# Patient Record
Sex: Male | Born: 1965 | Race: White | Hispanic: No | Marital: Married | Smoking: Never smoker
Health system: Southern US, Community
[De-identification: ages and names within clinical notes are randomized; demographics above are authoritative.]

## PROBLEM LIST (undated history)

## (undated) DIAGNOSIS — Z8719 Personal history of other diseases of the digestive system: Secondary | ICD-10-CM

## (undated) DIAGNOSIS — E785 Hyperlipidemia, unspecified: Secondary | ICD-10-CM

## (undated) DIAGNOSIS — E119 Type 2 diabetes mellitus without complications: Secondary | ICD-10-CM

## (undated) HISTORY — DX: Hyperlipidemia, unspecified: E78.5

## (undated) HISTORY — DX: Personal history of other diseases of the digestive system: Z87.19

## (undated) HISTORY — PX: APPENDECTOMY: SHX54

---

## 2004-10-08 ENCOUNTER — Other Ambulatory Visit: Payer: Self-pay

## 2004-10-08 ENCOUNTER — Emergency Department: Payer: Self-pay | Admitting: Emergency Medicine

## 2004-10-12 ENCOUNTER — Ambulatory Visit: Payer: Self-pay | Admitting: Emergency Medicine

## 2013-11-07 LAB — BASIC METABOLIC PANEL
Anion Gap: 6 — ABNORMAL LOW (ref 7–16)
BUN: 13 mg/dL (ref 7–18)
CHLORIDE: 99 mmol/L (ref 98–107)
CO2: 27 mmol/L (ref 21–32)
CREATININE: 0.98 mg/dL (ref 0.60–1.30)
Calcium, Total: 9.2 mg/dL (ref 8.5–10.1)
EGFR (African American): >60
EGFR (Non-African Amer.): >60
Glucose: 244 mg/dL — ABNORMAL HIGH (ref 65–99)
Osmolality: 273 (ref 275–301)
POTASSIUM: 3.9 mmol/L (ref 3.5–5.1)
Sodium: 132 mmol/L — ABNORMAL LOW (ref 136–145)

## 2013-11-07 LAB — CBC
HCT: 46.9 % (ref 40.0–52.0)
HGB: 15.8 g/dL (ref 13.0–18.0)
MCH: 30.2 pg (ref 26.0–34.0)
MCHC: 33.6 g/dL (ref 32.0–36.0)
MCV: 90 fL (ref 80–100)
PLATELETS: 363 10*3/uL (ref 150–440)
RBC: 5.23 10*6/uL (ref 4.40–5.90)
RDW: 12.4 % (ref 11.5–14.5)
WBC: 7.5 10*3/uL (ref 3.8–10.6)

## 2013-11-07 LAB — PROTIME-INR
INR: 1
PROTHROMBIN TIME: 12.6 s (ref 11.5–14.7)

## 2013-11-07 LAB — TROPONIN I: Troponin-I: <0.02 ng/mL

## 2013-11-07 LAB — PRO B NATRIURETIC PEPTIDE: B-Type Natriuretic Peptide: 8 pg/mL (ref 0–125)

## 2013-11-08 ENCOUNTER — Observation Stay: Payer: Self-pay | Admitting: Internal Medicine

## 2013-11-08 LAB — TROPONIN I: Troponin-I: <0.02 ng/mL

## 2013-11-08 LAB — LIPID PANEL
Cholesterol: 196 mg/dL (ref 0–200)
Cholesterol: 204 mg/dL — ABNORMAL HIGH (ref 0–200)
HDL Cholesterol: 36 mg/dL — ABNORMAL LOW (ref 40–60)
HDL: 36 mg/dL — AB (ref 40–60)
LDL CHOLESTEROL, CALC: 132 mg/dL — AB (ref 0–100)
Ldl Cholesterol, Calc: 127 mg/dL — ABNORMAL HIGH (ref 0–100)
TRIGLYCERIDES: 164 mg/dL (ref 0–200)
Triglycerides: 182 mg/dL (ref 0–200)
VLDL Cholesterol, Calc: 33 mg/dL (ref 5–40)
VLDL Cholesterol, Calc: 36 mg/dL (ref 5–40)

## 2013-11-08 LAB — CK-MB
CK-MB: 0.7 ng/mL (ref 0.5–3.6)
CK-MB: 0.8 ng/mL (ref 0.5–3.6)

## 2014-07-23 DIAGNOSIS — E119 Type 2 diabetes mellitus without complications: Secondary | ICD-10-CM | POA: Insufficient documentation

## 2014-07-23 DIAGNOSIS — E785 Hyperlipidemia, unspecified: Secondary | ICD-10-CM | POA: Insufficient documentation

## 2014-11-30 NOTE — Discharge Summary (Signed)
PATIENT NAME:  Luke Russo, Luke Russo MR#:  [id number] DATE OF BIRTH:  11-Sep-1965  DATE OF ADMISSION:  11/08/2013 DATE OF DISCHARGE:  11/08/2013  ADMITTING DIAGNOSIS: Chest pain.   DISCHARGE DIAGNOSES: 1.  Chest pain, felt to be noncardiac in origin, possible musculoskeletal, possible gastrointestinal-related.  2.  Diabetes.  3.  Hyperlipidemia.  4.  Gastroesophageal reflux disease.  5.  Alcohol use.   PERTINENT LABS AND EVALUATIONS: Admitting cholesterol 196, triglycerides 164, HDL 36, LDL 127. INR 1. BNP 8. Glucose 244, BUN 13, creatinine 0.98, sodium 132, potassium 3.9, chloride 99, CO2 27, calcium 9.2. WBC 7.5, hemoglobin 15.8, platelet count 366,000. Troponin less than 0.02.   PA and lateral chest x-ray shows no acute cardiopulmonary processes.   Fasting lipid panel: Total cholesterol 204, triglycerides 182, HDL 36, LDL 132.  Nuclear myocardial perfusion scan showed no wall motion abnormality, no perfusion defect, EF 73%.   HOSPITAL COURSE: Please refer to H and P done by the admitting physician. The patient is a 49 year old white male with history of diabetes and hypertension who presented with left-sided chest pain ongoing for the past 5 days. His pain was unusual in that it was worse with lying down and certain movements. It did not get worse with activity. Due to these symptoms, he was placed under observation and serial cardiac enzymes were done, which were negative. He underwent a stress MIBI which was negative for any ischemia or infarct. At this time, he is not having any chest pains and is stable for discharge. He will follow-up with cardiology. If he continues to have these symptoms, he will likely need a cardiac cath.   DISCHARGE MEDICATIONS: Gemfibrozil 600 one tab p.o. b.i.d., Flonase 1 spray daily, omeprazole 20 daily, lovastatin 40 daily, glipizide 10 mg 1 tab p.o. b.i.d., metformin 500 one tab p.o. t.i.d., Tradjenta 5 mg daily, aspirin 81 one tab p.o. daily.   DISCHARGE DIET:  Low-sodium, low-fat, low-cholesterol, carbohydrate-controlled.  DISCHARGE ACTIVITY: As tolerated.   DISCHARGE FOLLOWUP: Follow up with Dr. Humphrey Rolls next week. Follow with primary MD in 1 to 2 weeks.   TIME SPENT ON DISCHARGE: 32 minutes.  ____________________________ Lafonda Mosses Posey Pronto, MD shp:sb D: 11/09/2013 08:53:06 ET T: 11/09/2013 09:24:14 ET JOB#: [id number]  cc: Kavaughn Faucett H. Posey Pronto, MD, <Dictator> Alric Seton MD ELECTRONICALLY SIGNED 11/16/2013 8:49

## 2014-11-30 NOTE — Consult Note (Signed)
PATIENT NAME:  Luke Russo, Luke Russo MR#:  [id number] DATE OF BIRTH:  Nov 23, 1965  DATE OF CONSULTATION:  11/08/2013  CONSULTING PHYSICIAN: Dionisio David, M.D.   INDICATION FOR CONSULTATION: Chest pain.   HISTORY OF PRESENT ILLNESS: This is a 49 year old white male with a past medical history of diabetes. No history of hypertension, hyperlipidemia, who presented to the Emergency Room with chest pain. The chest pain was described as dull to pressure-type associated with shortness of breath. He denies any chest pain during stress test or after the stress test.   PAST MEDICAL HISTORY: As mentioned, history of diabetes.   SOCIAL HISTORY: He denies smoking, but drinks four glasses of bourbon every day after work.   FAMILY HISTORY: Positive for coronary artery disease.   PHYSICAL EXAMINATION: GENERAL: He was alert, oriented x 3, in no acute distress.  VITALS: Stable.  NECK: No JVD.  LUNGS: Clear.  HEART: Regular rate and rhythm. Normal S1, S2. No audible murmur.  ABDOMEN: Soft, nontender, positive bowel sounds.  EXTREMITIES: No pedal edema.  NEUROLOGIC: The patient appears to be intact.   EKG showed normal sinus rhythm, left ventricular hypertrophy, no acute changes. Cardiac enzymes were negative. Nuclear stress test was done which showed no perfusion defect, ejection fraction 73%. There was mild distal septal wall reversible defect, but it appears to be artifact. Advise; however, follow-up in the office at 11:00 on Tuesday next week.   Thank you very much for the referral.   ____________________________ Dionisio David, MD sak:sg D: 11/08/2013 14:09:21 ET T: 11/08/2013 14:28:06 ET JOB#: [id number]  cc: Dionisio David, MD, <Dictator> Dionisio David MD ELECTRONICALLY SIGNED 12/14/2013 11:02

## 2014-11-30 NOTE — H&P (Signed)
PATIENT NAME:  ESPIRIDION, Luke Russo MR#:  [id number] DATE OF BIRTH:  04-08-1966  DATE OF ADMISSION:  11/07/2013  PRIMARY CARE PHYSICIAN:  Dr. Juluis Pitch.   REFERRING PHYSICIAN:  Dr. Robet Leu.   CHIEF COMPLAINT:  Chest pain.   HISTORY OF PRESENT ILLNESS:  [name] is a 49 year old male with history of diabetes mellitus, on oral medication who comes to the Emergency Department with complaints of chest pain for the last five days.  The pain is on the left lateral aspect of the chest on and off.  Could not tell exacerbating and relieving factors.  Usually it is relieved by itself.  Does not get worse with taking a deep breath, moving.  The patient walks around at work most of the day without having any chest pain, palpitations.  Work-up in the Emergency Department with EKG and cardiac enzymes were unremarkable.  Concerning about the diabetes mellitus, the decision is made to admit the patient under observation.  Today, the patient started to experience severe pain on the left side of the chest radiating to both arms, especially left hand, felt cool to touch.  The patient currently denies having any symptoms.  PAST MEDICAL HISTORY: 1.  Diabetes mellitus.  2.  Hyperlipidemia.   PAST SURGICAL HISTORY:  None.   ALLERGIES:  No known drug allergies.   HOME MEDICATIONS:  1.  Tradjenta 5 mg once a day. 2.  Omeprazole 20 mg once a day.  3.  Metformin 500 mg 3 times a day.  4.  Lovastatin 40 mg once a day.  5.  Glipizide 10 mg 2 times a day.  6.  Gemfibrozil 600 mg 2 times a day.  7.  Flonase once a day.   SOCIAL HISTORY:  No history of smoking, drinking alcohol or using illicit drugs.  Married, lives with his wife.  Works as a Librarian, academic.   FAMILY HISTORY:  Mother died of heart problems.  Father had coronary artery bypass grafting.   REVIEW OF SYSTEMS:  CONSTITUTIONAL:  Denies any generalized weakness.  EYES:  No change in vision.  EARS, NOSE, THROAT:  No change in hearing.  RESPIRATORY:   No cough, shortness of breath.  CARDIOVASCULAR:  Has chest pain.  GASTROINTESTINAL:  No nausea, vomiting, abdominal pain.  GENITOURINARY:  No dysuria or hematuria.  ENDOCRINE:  No polyuria or polydipsia.  Has a diagnosis of diabetes mellitus.  HEMATOLOGY:  No easy bruising or bleeding. SKIN:  No rashes or lesions.  MUSCULOSKELETAL:  No joint pains and aches.  NEUROLOGIC:  No weakness or numbness in any part of the body.     PHYSICAL EXAMINATION: GENERAL:  This is a well-built, well-nourished, age-appropriate male lying down in the bed not in distress.  VITAL SIGNS:  Temperature 98.3, pulse 96, blood pressure 153/84, respiratory rate of 16, oxygen saturation is 98% on room air.  HEENT:  Head normocephalic, atraumatic.  Eyes, no scleral icterus.  Conjunctivae normal.  Pupils equal and react to light.  Extraocular movements are intact.  Mucous membranes moist.  No pharyngeal erythema.  NECK:  Supple.  No lymphadenopathy.  No JVD.  No carotid bruit.  CHEST:  Has no focal tenderness.  LUNGS:  Bilateral clear to auscultation.  HEART:  S1, S2 regular.  No murmurs are heard.  ABDOMEN:  Bowel sounds plus.  Soft, nontender, nondistended.  No hepatosplenomegaly.  EXTREMITIES:  No pedal edema.  Pulses 2+. NEUROLOGIC:  The patient is alert, oriented to place, person and time.  Cranial nerves  II through XII intact.  Motor 5 by 5 in upper and lower extremities.  SKIN:  No rash or lesions.  MUSCULOSKELETAL:  Good range of motion in all the extremities.   LABORATORY DATA:  Chest x-ray, PA and lateral:  No acute cardiopulmonary disease.   Coag profile is well within normal limits.  BNP is 8.  CBC, CMP are completely within normal limits.  Troponin less than 0.02.   EKG, 12-lead:  Normal sinus rhythm with no ST-T wave abnormalities.   ASSESSMENT AND PLAN:  [name] is a 49 year old male who comes to the Emergency Department with chest pain for five days duration.  1.  Chest pain.  This seems to be  more atypical pain, however considering the patient's history of diabetes mellitus and hyperlipidemia, admit the patient to a monitored bed, cycle cardiac enzymes x 3.  If negative, the patient can undergo exercise stress test in the morning.  If it is negative, the patient could be discharged home.  Keep the patient on aspirin and obtain lipid profile.  2.  Diabetes mellitus.  We will hold metformin for now.  Continue the glipizide and Tradjenta.  3.  Hyperlipidemia.  Continue with lovastatin.  4.  Keep the patient on deep vein thrombosis prophylaxis with Lovenox.   TIME SPENT:  45 minutes.     ____________________________ Monica Becton, MD pv:ea D: 11/07/2013 23:54:40 ET T: 11/08/2013 00:34:54 ET JOB#: [id number]  cc: Monica Becton, MD, <Dictator> Youlanda Roys. Lovie Macadamia, MD Monica Becton MD ELECTRONICALLY SIGNED 11/22/2013 0:24

## 2015-07-11 DIAGNOSIS — Z8719 Personal history of other diseases of the digestive system: Secondary | ICD-10-CM | POA: Insufficient documentation

## 2017-06-29 ENCOUNTER — Other Ambulatory Visit: Payer: Self-pay | Admitting: Family Medicine

## 2017-06-29 DIAGNOSIS — R1031 Right lower quadrant pain: Secondary | ICD-10-CM

## 2017-07-06 ENCOUNTER — Ambulatory Visit
Admission: RE | Admit: 2017-07-06 | Discharge: 2017-07-06 | Disposition: A | Discharge status: Home / Self Care | Payer: BLUE CROSS/BLUE SHIELD | Source: Ambulatory Visit | Attending: Family Medicine | Admitting: Family Medicine

## 2017-07-06 DIAGNOSIS — R1031 Right lower quadrant pain: Secondary | ICD-10-CM | POA: Diagnosis not present

## 2017-07-06 HISTORY — DX: Type 2 diabetes mellitus without complications: E11.9

## 2017-07-06 MED ORDER — IOPAMIDOL (ISOVUE-300) INJECTION 61%
100.0000 mL | Freq: Once | INTRAVENOUS | Status: AC | PRN
  Administered 2017-07-06: 100 mL via INTRAVENOUS

## 2017-07-08 ENCOUNTER — Ambulatory Visit
Admission: RE | Admit: 2017-07-08 | Discharge: 2017-07-08 | Disposition: A | Discharge status: Home / Self Care | Payer: BLUE CROSS/BLUE SHIELD | Source: Ambulatory Visit | Attending: Chiropractor | Admitting: Chiropractor

## 2017-07-08 ENCOUNTER — Other Ambulatory Visit: Payer: Self-pay | Admitting: Chiropractor

## 2017-07-08 DIAGNOSIS — R109 Unspecified abdominal pain: Secondary | ICD-10-CM | POA: Insufficient documentation

## 2017-07-08 DIAGNOSIS — R0781 Pleurodynia: Secondary | ICD-10-CM | POA: Insufficient documentation

## 2017-07-12 ENCOUNTER — Other Ambulatory Visit: Payer: Self-pay | Admitting: Internal Medicine

## 2017-07-12 DIAGNOSIS — R109 Unspecified abdominal pain: Secondary | ICD-10-CM

## 2017-07-18 ENCOUNTER — Ambulatory Visit: Admission: RE | Admit: 2017-07-18 | Payer: BLUE CROSS/BLUE SHIELD | Source: Ambulatory Visit

## 2017-07-18 ENCOUNTER — Ambulatory Visit
Admission: RE | Admit: 2017-07-18 | Discharge: 2017-07-18 | Disposition: A | Discharge status: Home / Self Care | Payer: BLUE CROSS/BLUE SHIELD | Source: Ambulatory Visit | Attending: Internal Medicine | Admitting: Internal Medicine

## 2017-07-18 DIAGNOSIS — R109 Unspecified abdominal pain: Secondary | ICD-10-CM | POA: Insufficient documentation

## 2017-07-21 ENCOUNTER — Inpatient Hospital Stay: Payer: BLUE CROSS/BLUE SHIELD

## 2017-07-21 ENCOUNTER — Other Ambulatory Visit: Payer: Self-pay

## 2017-07-21 ENCOUNTER — Encounter: Payer: Self-pay | Admitting: Oncology

## 2017-07-21 ENCOUNTER — Inpatient Hospital Stay: Payer: BLUE CROSS/BLUE SHIELD | Attending: Oncology | Admitting: Oncology

## 2017-07-21 VITALS — BP 139/82 | HR 99 | Temp 99.0°F | Wt 140.8 lb

## 2017-07-21 DIAGNOSIS — R1011 Right upper quadrant pain: Secondary | ICD-10-CM | POA: Insufficient documentation

## 2017-07-21 DIAGNOSIS — Z79899 Other long term (current) drug therapy: Secondary | ICD-10-CM | POA: Insufficient documentation

## 2017-07-21 DIAGNOSIS — Z7984 Long term (current) use of oral hypoglycemic drugs: Secondary | ICD-10-CM | POA: Insufficient documentation

## 2017-07-21 DIAGNOSIS — D75839 Thrombocytosis, unspecified: Secondary | ICD-10-CM

## 2017-07-21 DIAGNOSIS — D473 Essential (hemorrhagic) thrombocythemia: Secondary | ICD-10-CM | POA: Diagnosis not present

## 2017-07-21 DIAGNOSIS — E785 Hyperlipidemia, unspecified: Secondary | ICD-10-CM | POA: Insufficient documentation

## 2017-07-21 DIAGNOSIS — E119 Type 2 diabetes mellitus without complications: Secondary | ICD-10-CM | POA: Insufficient documentation

## 2017-07-21 DIAGNOSIS — K449 Diaphragmatic hernia without obstruction or gangrene: Secondary | ICD-10-CM | POA: Insufficient documentation

## 2017-07-21 DIAGNOSIS — R109 Unspecified abdominal pain: Secondary | ICD-10-CM | POA: Diagnosis not present

## 2017-07-21 LAB — COMPREHENSIVE METABOLIC PANEL
ALK PHOS: 78 U/L (ref 38–126)
ALT: 16 U/L — ABNORMAL LOW (ref 17–63)
ANION GAP: 9 (ref 5–15)
AST: 19 U/L (ref 15–41)
Albumin: 4.6 g/dL (ref 3.5–5.0)
BILIRUBIN TOTAL: 0.5 mg/dL (ref 0.3–1.2)
BUN: 21 mg/dL — ABNORMAL HIGH (ref 6–20)
CALCIUM: 9.5 mg/dL (ref 8.9–10.3)
CO2: 26 mmol/L (ref 22–32)
Chloride: 100 mmol/L — ABNORMAL LOW (ref 101–111)
Creatinine, Ser: 0.89 mg/dL (ref 0.61–1.24)
GFR calc non Af Amer: >60 mL/min (ref >60)
Glucose, Bld: 346 mg/dL — ABNORMAL HIGH (ref 65–99)
Potassium: 4.5 mmol/L (ref 3.5–5.1)
SODIUM: 135 mmol/L (ref 135–145)
TOTAL PROTEIN: 8.1 g/dL (ref 6.5–8.1)

## 2017-07-21 LAB — CBC WITH DIFFERENTIAL/PLATELET
Basophils Absolute: 0.1 10*3/uL (ref 0–0.1)
Basophils Relative: 1 %
EOS ABS: 0.1 10*3/uL (ref 0–0.7)
Eosinophils Relative: 2 %
HCT: 43.4 % (ref 40.0–52.0)
HEMOGLOBIN: 14.9 g/dL (ref 13.0–18.0)
Lymphocytes Relative: 26 %
Lymphs Abs: 1.5 10*3/uL (ref 1.0–3.6)
MCH: 30.2 pg (ref 26.0–34.0)
MCHC: 34.4 g/dL (ref 32.0–36.0)
MCV: 87.6 fL (ref 80.0–100.0)
MONOS PCT: 10 %
Monocytes Absolute: 0.6 10*3/uL (ref 0.2–1.0)
NEUTROS ABS: 3.5 10*3/uL (ref 1.4–6.5)
NEUTROS PCT: 61 %
Platelets: 494 10*3/uL — ABNORMAL HIGH (ref 150–440)
RBC: 4.95 MIL/uL (ref 4.40–5.90)
RDW: 12.1 % (ref 11.5–14.5)
WBC: 5.7 10*3/uL (ref 3.8–10.6)

## 2017-07-21 LAB — IRON AND TIBC
IRON: 91 ug/dL (ref 45–182)
Saturation Ratios: 20 % (ref 17.9–39.5)
TIBC: 448 ug/dL (ref 250–450)
UIBC: 357 ug/dL

## 2017-07-21 LAB — LACTATE DEHYDROGENASE: LDH: 104 U/L (ref 98–192)

## 2017-07-21 LAB — TECHNOLOGIST SMEAR REVIEW

## 2017-07-21 LAB — FERRITIN: Ferritin: 202 ng/mL (ref 24–336)

## 2017-07-21 NOTE — Progress Notes (Signed)
Patient here today as a new patient  

## 2017-07-21 NOTE — Progress Notes (Signed)
Hematology/Oncology Consult note [facility] [phone] [phone]   Patient Care Team: Juluis Pitch, MD as PCP - General (Family Medicine)  REFERRING PROVIDER: Juluis Pitch, MD CHIEF COMPLAINTS/PURPOSE OF CONSULTATION:  Evaluation of Thrombocytosis and abdominal pain.   HISTORY OF PRESENTING ILLNESS:  Luke Russo is a  51 y.o.  male with PMH listed below who was referred to me for evaluation of thrombocytosis. He had lab work done recently which showed platelet level of 567. Patient also reports that he has had constant right upper quadrant pain, sharp. He was seen by DrToledo and has had a negative work up including US kidney, CT abdomen pelvis.  Repors appetite is not good and has weight loss recently. feeling tired. He has poorly uncontrolled DM and has been recently started on multiple DM medicaiton.      Review of Systems  Constitutional: Negative for fever.  HENT: Negative for hearing loss.   Eyes: Negative for blurred vision.  Respiratory: Negative for cough.   Cardiovascular: Negative for chest pain.  Gastrointestinal: Positive for abdominal pain.  Genitourinary: Negative for dysuria.  Musculoskeletal: Negative for myalgias.  Skin: Negative for rash.  Neurological: Negative for dizziness.  Endo/Heme/Allergies: Does not bruise/bleed easily.  Psychiatric/Behavioral: Negative for depression.    MEDICAL HISTORY:  Past Medical History:  Diagnosis Date  . Diabetes mellitus without complication ([place])     SURGICAL HISTORY: Past Surgical History:  Procedure Laterality Date  . APPENDECTOMY      SOCIAL HISTORY: Social History   Socioeconomic History  . Marital status: Married    Spouse name: Not on file  . Number of children: Not on file  . Years of education: Not on file  . Highest education level: Not on file  Social Needs  . Financial resource strain: Not on file  . Food insecurity - worry: Not on file   . Food insecurity - inability: Not on file  . Transportation needs - medical: Not on file  . Transportation needs - non-medical: Not on file  Occupational History  . Not on file  Tobacco Use  . Smoking status: Not on file  Substance and Sexual Activity  . Alcohol use: Not on file  . Drug use: Not on file  . Sexual activity: Not on file  Other Topics Concern  . Not on file  Social History Narrative  . Not on file    FAMILY HISTORY: Family History  Problem Relation Age of Onset  . Diabetes Mother   . Breast cancer Mother   . Thyroid disease Mother   . Heart disease Mother   . Heart disease Father   . Hypertension Father   . Lupus Sister   . Diabetes Maternal Grandmother   . Heart disease Maternal Grandmother   . Diabetes Maternal Grandfather   . Heart disease Maternal Grandfather     ALLERGIES:  has No Known Allergies.  MEDICATIONS:  Current Outpatient Medications  Medication Sig Dispense Refill  . ALPRAZolam (XANAX) 0.5 MG tablet Take 0.5 mg by mouth as needed.  0  . FARXIGA 10 MG TABS tablet Take 10 mg by mouth daily.  1  . fluticasone (FLONASE) 50 MCG/ACT nasal spray instill 2 sprays into each nostril once daily    . gemfibrozil (LOPID) 600 MG tablet Take 600 mg by mouth 2 (two) times daily.  0  . glipiZIDE (GLUCOTROL) 10 MG tablet Take 10 mg by mouth 2 (two) times daily.  0  . lovastatin (MEVACOR) 40  MG tablet Take 40 mg by mouth daily.  0  . meloxicam (MOBIC) 15 MG tablet Take 15 mg by mouth daily.  0  . metFORMIN (GLUCOPHAGE-XR) 750 MG 24 hr tablet Take 750 mg by mouth 2 (two) times daily.  0  . omeprazole (PRILOSEC) 20 MG capsule Take 20 mg by mouth 2 (two) times daily.  0  . pioglitazone (ACTOS) 30 MG tablet Take 30 mg by mouth daily.    . traMADol (ULTRAM) 50 MG tablet Take 50 mg by mouth every 6 (six) hours as needed.  0   No current facility-administered medications for this visit.      PHYSICAL EXAMINATION: ECOG PERFORMANCE STATUS: 1 - Symptomatic  but completely ambulatory Vitals:   07/21/17 1120 07/21/17 1133  BP: 139/82 139/82  Pulse: 99 99  Temp: 99 F (37.2 C)    Filed Weights   07/21/17 1120 07/21/17 1133  Weight: 140 lb 12.2 oz (63.9 kg) 140 lb 12.2 oz (63.9 kg)    Physical Exam  Constitutional: He is oriented to person, place, and time and well-developed, well-nourished, and in no distress. No distress.  HENT:  Head: Normocephalic and atraumatic.  Mouth/Throat: No oropharyngeal exudate.  Eyes: Conjunctivae and EOM are normal. Pupils are equal, round, and reactive to light. Scleral icterus is present.  Neck: Normal range of motion. Neck supple.  Cardiovascular: Normal rate and regular rhythm.  Pulmonary/Chest: Effort normal and breath sounds normal.  Abdominal: Soft. Bowel sounds are normal. He exhibits no distension.  (+) hernia  Musculoskeletal: Normal range of motion. He exhibits no edema.  Lymphadenopathy:    He has no cervical adenopathy.  Neurological: He is alert and oriented to person, place, and time. Gait normal.  Skin: Skin is warm and dry.  Psychiatric: Affect normal.     LABORATORY DATA:  I have reviewed the data as listed Lab Results  Component Value Date   WBC 7.5 11/07/2013   HGB 15.8 11/07/2013   HCT 46.9 11/07/2013   MCV 90 11/07/2013   PLT 363 11/07/2013   Recent Labs    07/21/17 1159  NA 135  K 4.5  CL 100*  CO2 26  GLUCOSE 346*  BUN 21*  CREATININE 0.89  CALCIUM 9.5  GFRNONAA >60  GFRAA >60  PROT 8.1  ALBUMIN 4.6  AST 19  ALT 16*  ALKPHOS 78  BILITOT 0.5       ASSESSMENT & PLAN:  1. Thrombocytosis ([place])   2. Right-sided abdominal pain of unknown cause    I discussed with patient that the differential diagnosis of the thrombosis is broad, including benign etiology such as reactive to surgery, trauma, infection, nutrition deficiency, etc, as well as malignant etiology including underlying bone marrow disorders.   For the work up of patient's thrombocytosis, I  recommend checking CBC;CMP, LDH, iron panel, if persistent thrombocytosis, will send JAK 2 mutation,  BCR ABL; MPL; CALR mutation.   # etiology of abdominal pain is unknown. Was seen by surgery for possible inguinal hernia Dr. Tamala Julian did not feel that was likely contributing to his symptoms.  Also discussed possible bone marrow biopsy if above workup is inconclusive; however I would prefer not to do a bone marrow unless absolutely needed. All questions were answered. The patient knows to call the clinic with any problems questions or concerns.  Return of visit: 1 week. Thank you for this kind referral and the opportunity to participate in the care of this patient. A copy of today's note  is routed to referring provider    Earlie Server, MD, PhD Hematology Oncology [facility] at [facility] Pager- [phone] 07/21/2017

## 2017-07-22 ENCOUNTER — Encounter: Payer: Self-pay | Admitting: Oncology

## 2017-07-28 ENCOUNTER — Encounter: Payer: Self-pay | Admitting: Oncology

## 2017-07-28 ENCOUNTER — Other Ambulatory Visit: Payer: Self-pay

## 2017-07-28 ENCOUNTER — Inpatient Hospital Stay: Payer: BLUE CROSS/BLUE SHIELD

## 2017-07-28 ENCOUNTER — Inpatient Hospital Stay (HOSPITAL_BASED_OUTPATIENT_CLINIC_OR_DEPARTMENT_OTHER): Payer: BLUE CROSS/BLUE SHIELD | Admitting: Oncology

## 2017-07-28 VITALS — BP 131/79 | HR 90 | Temp 96.9°F | Wt 142.4 lb

## 2017-07-28 DIAGNOSIS — R1011 Right upper quadrant pain: Secondary | ICD-10-CM | POA: Diagnosis not present

## 2017-07-28 DIAGNOSIS — D75839 Thrombocytosis, unspecified: Secondary | ICD-10-CM

## 2017-07-28 DIAGNOSIS — D473 Essential (hemorrhagic) thrombocythemia: Secondary | ICD-10-CM

## 2017-07-28 DIAGNOSIS — R109 Unspecified abdominal pain: Secondary | ICD-10-CM

## 2017-07-28 LAB — CBC WITH DIFFERENTIAL/PLATELET
Basophils Absolute: 0.1 10*3/uL (ref 0–0.1)
Basophils Relative: 1 %
Eosinophils Absolute: 0.1 10*3/uL (ref 0–0.7)
Eosinophils Relative: 3 %
HEMATOCRIT: 42.6 % (ref 40.0–52.0)
HEMOGLOBIN: 14.7 g/dL (ref 13.0–18.0)
LYMPHS ABS: 1.9 10*3/uL (ref 1.0–3.6)
LYMPHS PCT: 34 %
MCH: 30.3 pg (ref 26.0–34.0)
MCHC: 34.4 g/dL (ref 32.0–36.0)
MCV: 88.1 fL (ref 80.0–100.0)
MONO ABS: 0.5 10*3/uL (ref 0.2–1.0)
MONOS PCT: 9 %
NEUTROS ABS: 3 10*3/uL (ref 1.4–6.5)
Neutrophils Relative %: 53 %
Platelets: 462 10*3/uL — ABNORMAL HIGH (ref 150–440)
RBC: 4.84 MIL/uL (ref 4.40–5.90)
RDW: 12.1 % (ref 11.5–14.5)
WBC: 5.6 10*3/uL (ref 3.8–10.6)

## 2017-07-28 NOTE — Progress Notes (Signed)
Hematology/Oncology Consult note [facility] Telephone:([phone] [phone]   Patient Care Team: Juluis Pitch, MD as PCP - General (Family Medicine)  REFERRING PROVIDER: Juluis Pitch, MD CHIEF COMPLAINTS/PURPOSE OF CONSULTATION:  Evaluation of Thrombocytosis and abdominal pain.   HISTORY OF PRESENTING ILLNESS:  Luke Russo is a  51 y.o.  male with PMH listed below who was referred to me for evaluation of thrombocytosis. He had lab work done recently which showed platelet level of 567. Patient also reports that he has had constant right upper quadrant pain, sharp. He was seen by DrToledo and has had a negative work up including US kidney, CT abdomen pelvis.  Repors appetite is not good and has weight loss recently. feeling tired. He has poorly uncontrolled DM and has been recently started on multiple DM medicaiton.   INTERVAL HISTORY Luke Russo is a 51 y.o. male who has above history reviewed by me today presents for follow up visit for management of abnormal labs. He continues to have right flank pain, which he reports starts at this back and wrap around to the front. Also reports skin sensitivity of the right flank when his cloth rubs the skin.   Review of Systems  Constitutional: Negative for fever.  HENT: Negative for hearing loss.   Eyes: Negative for blurred vision.  Respiratory: Negative for cough.   Cardiovascular: Negative for chest pain.  Gastrointestinal: Negative for vomiting.       Right flank pain  Genitourinary: Negative for dysuria.  Musculoskeletal: Negative for myalgias.  Skin: Negative for rash.  Neurological: Negative for dizziness.  Endo/Heme/Allergies: Does not bruise/bleed easily.  Psychiatric/Behavioral: Negative for depression.    MEDICAL HISTORY:  Past Medical History:  Diagnosis Date  . Diabetes mellitus without complication ([place])   . History of hiatal hernia   . Hyperlipidemia     SURGICAL  HISTORY: Past Surgical History:  Procedure Laterality Date  . APPENDECTOMY      SOCIAL HISTORY: Social History   Socioeconomic History  . Marital status: Married    Spouse name: Not on file  . Number of children: Not on file  . Years of education: Not on file  . Highest education level: Not on file  Social Needs  . Financial resource strain: Not on file  . Food insecurity - worry: Not on file  . Food insecurity - inability: Not on file  . Transportation needs - medical: Not on file  . Transportation needs - non-medical: Not on file  Occupational History  . Not on file  Tobacco Use  . Smoking status: Never Smoker  . Smokeless tobacco: Never Used  Substance and Sexual Activity  . Alcohol use: No    Frequency: Never  . Drug use: No  . Sexual activity: Not on file  Other Topics Concern  . Not on file  Social History Narrative  . Not on file    FAMILY HISTORY: Family History  Problem Relation Age of Onset  . Diabetes Mother   . Breast cancer Mother   . Thyroid disease Mother   . Heart disease Mother   . Heart disease Father   . Hypertension Father   . Lupus Sister   . Diabetes Maternal Grandmother   . Heart disease Maternal Grandmother   . Diabetes Maternal Grandfather   . Heart disease Maternal Grandfather     ALLERGIES:  has No Known Allergies.  MEDICATIONS:  Current Outpatient Medications  Medication Sig Dispense Refill  . ALPRAZolam Duanne Moron)  0.5 MG tablet Take 0.5 mg by mouth as needed.  0  . FARXIGA 10 MG TABS tablet Take 10 mg by mouth daily.  1  . fluticasone (FLONASE) 50 MCG/ACT nasal spray instill 2 sprays into each nostril once daily    . gemfibrozil (LOPID) 600 MG tablet Take 600 mg by mouth 2 (two) times daily.  0  . glipiZIDE (GLUCOTROL) 10 MG tablet Take 10 mg by mouth 2 (two) times daily.  0  . lovastatin (MEVACOR) 40 MG tablet Take 40 mg by mouth daily.  0  . meloxicam (MOBIC) 15 MG tablet Take 15 mg by mouth daily.  0  . metFORMIN  (GLUCOPHAGE-XR) 750 MG 24 hr tablet Take 750 mg by mouth 2 (two) times daily.  0  . omeprazole (PRILOSEC) 20 MG capsule Take 20 mg by mouth 2 (two) times daily.  0  . pioglitazone (ACTOS) 30 MG tablet Take 30 mg by mouth daily.    . traMADol (ULTRAM) 50 MG tablet Take 50 mg by mouth every 6 (six) hours as needed.  0   No current facility-administered medications for this visit.      PHYSICAL EXAMINATION: ECOG PERFORMANCE STATUS: 1 - Symptomatic but completely ambulatory Vitals:   07/28/17 1009  BP: 131/79  Pulse: 90  Temp: (!) 96.9 F (36.1 C)   Filed Weights   07/28/17 1009  Weight: 142 lb 6.7 oz (64.6 kg)    Physical Exam  Constitutional: He is oriented to person, place, and time and well-developed, well-nourished, and in no distress. No distress.  HENT:  Head: Normocephalic and atraumatic.  Mouth/Throat: No oropharyngeal exudate.  Eyes: Conjunctivae and EOM are normal. Pupils are equal, round, and reactive to light. No scleral icterus.  Neck: Normal range of motion. Neck supple. No JVD present.  Cardiovascular: Normal rate, regular rhythm and normal heart sounds.  No murmur heard. Pulmonary/Chest: Effort normal and breath sounds normal. No respiratory distress.  Abdominal: Soft. Bowel sounds are normal. He exhibits no distension. There is no tenderness.  (+) hernia  Musculoskeletal: Normal range of motion. He exhibits no edema.  Lymphadenopathy:    He has no cervical adenopathy.  Neurological: He is alert and oriented to person, place, and time. Gait normal.  Skin: Skin is warm and dry. No rash noted.  Psychiatric: Affect and judgment normal.     LABORATORY DATA:  I have reviewed the data as listed Lab Results  Component Value Date   WBC 5.7 07/21/2017   HGB 14.9 07/21/2017   HCT 43.4 07/21/2017   MCV 87.6 07/21/2017   PLT 494 (H) 07/21/2017   Recent Labs    07/21/17 1159  NA 135  K 4.5  CL 100*  CO2 26  GLUCOSE 346*  BUN 21*  CREATININE 0.89    CALCIUM 9.5  GFRNONAA >60  GFRAA >60  PROT 8.1  ALBUMIN 4.6  AST 19  ALT 16*  ALKPHOS 78  BILITOT 0.5       ASSESSMENT & PLAN:  1. Thrombocytosis ([place])   2. Right-sided abdominal pain of unknown cause    Iron panel reviewed, less likely iron deficiency. Normal LDH, normal LFT.  Persistent thrombocytosis, ?myeloproliferative disease.  Check JAK 2 mutation,  BCR ABL; MPL; CALR mutation.  Also discussed possible bone marrow biopsy if above workup is inconclusive; however I would prefer not to do a bone marrow unless absolutely needed. # etiology of abdominal pain is unknown. Skin sensitivity, ? Neuropathy.  All questions were answered. The  patient knows to call the clinic with any problems questions or concerns.  Return of visit: 4 weeks.   Earlie Server, MD, PhD Hematology Oncology [facility] at [facility] Pager- [phone] 07/28/2017

## 2017-07-28 NOTE — Progress Notes (Signed)
Patient here today for follow up.  Patient states no new concerns today  

## 2017-08-01 LAB — COMP PANEL: LEUKEMIA/LYMPHOMA

## 2017-08-08 LAB — CALR + JAK2 E12-15 + MPL (REFLEXED)

## 2017-08-08 LAB — JAK2 V617F, W REFLEX TO CALR/E12/MPL

## 2017-08-10 LAB — BCR-ABL1 FISH
Cells Analyzed: 200
Cells Counted: 200
PDF LCA: 0

## 2017-08-19 ENCOUNTER — Encounter: Payer: Self-pay | Admitting: Oncology

## 2017-08-22 ENCOUNTER — Other Ambulatory Visit: Payer: Self-pay | Admitting: *Deleted

## 2017-08-22 DIAGNOSIS — D75839 Thrombocytosis, unspecified: Secondary | ICD-10-CM

## 2017-08-22 DIAGNOSIS — D473 Essential (hemorrhagic) thrombocythemia: Secondary | ICD-10-CM

## 2017-08-24 NOTE — Progress Notes (Signed)
Hematology/Oncology Follow up Note [facility] [phone] [phone]   Patient Care Team: Juluis Pitch, MD as PCP - General (Family Medicine)  REFERRING PROVIDER: Juluis Pitch, MD CHIEF COMPLAINTS/PURPOSE OF CONSULTATION:  Evaluation of Thrombocytosis and abdominal pain.   HISTORY OF PRESENTING ILLNESS:  Luke Russo is a  52 y.o.  male with PMH listed below who was referred to me for evaluation of thrombocytosis. He had lab work done recently which showed platelet level of 567. Patient also reports that he has had constant right upper quadrant pain, sharp. He was seen by DrToledo and has had a negative work up including US kidney, CT abdomen pelvis.  Repors appetite is not good and has weight loss recently. feeling tired. He has poorly uncontrolled DM and has been recently started on multiple DM medicaiton.   INTERVAL HISTORY Luke Russo is a 52 y.o. male who has above history reviewed by me today presents for follow up visit for management of abnormal platelet counts.  He continues to have right flank discomfort and he was started on Lyrica which helps the symptoms. He has been referred to see Surgery at [facility].  Otherwise he feels well without new complaints.   Review of Systems  Constitutional: Negative for chills, fever and weight loss.  HENT: Negative for hearing loss.   Eyes: Negative for blurred vision and photophobia.  Respiratory: Negative for cough.   Cardiovascular: Negative for chest pain and orthopnea.  Gastrointestinal: Negative for abdominal pain and vomiting.       Right flank pain  Genitourinary: Negative for dysuria and urgency.  Musculoskeletal: Negative for myalgias.  Skin: Negative for rash.  Neurological: Negative for dizziness and tremors.  Endo/Heme/Allergies: Does not bruise/bleed easily.  Psychiatric/Behavioral: Negative for depression and substance abuse.    MEDICAL HISTORY:  Past Medical  History:  Diagnosis Date  . Diabetes mellitus without complication ([place])   . History of hiatal hernia   . Hyperlipidemia     SURGICAL HISTORY: Past Surgical History:  Procedure Laterality Date  . APPENDECTOMY      SOCIAL HISTORY: Social History   Socioeconomic History  . Marital status: Married    Spouse name: Not on file  . Number of children: Not on file  . Years of education: Not on file  . Highest education level: Not on file  Social Needs  . Financial resource strain: Not on file  . Food insecurity - worry: Not on file  . Food insecurity - inability: Not on file  . Transportation needs - medical: Not on file  . Transportation needs - non-medical: Not on file  Occupational History  . Not on file  Tobacco Use  . Smoking status: Never Smoker  . Smokeless tobacco: Never Used  Substance and Sexual Activity  . Alcohol use: No    Frequency: Never  . Drug use: No  . Sexual activity: Not on file  Other Topics Concern  . Not on file  Social History Narrative  . Not on file    FAMILY HISTORY: Family History  Problem Relation Age of Onset  . Diabetes Mother   . Breast cancer Mother   . Thyroid disease Mother   . Heart disease Mother   . Heart disease Father   . Hypertension Father   . Lupus Sister   . Diabetes Maternal Grandmother   . Heart disease Maternal Grandmother   . Diabetes Maternal Grandfather   . Heart disease Maternal Grandfather  ALLERGIES:  has No Known Allergies.  MEDICATIONS:  Current Outpatient Medications  Medication Sig Dispense Refill  . ALPRAZolam (XANAX) 0.5 MG tablet Take 0.5 mg by mouth as needed.  0  . FARXIGA 10 MG TABS tablet Take 10 mg by mouth daily.  1  . fluticasone (FLONASE) 50 MCG/ACT nasal spray instill 2 sprays into each nostril once daily    . gemfibrozil (LOPID) 600 MG tablet Take 600 mg by mouth 2 (two) times daily.  0  . glipiZIDE (GLUCOTROL) 10 MG tablet Take 10 mg by mouth 2 (two) times daily.  0  . lovastatin  (MEVACOR) 40 MG tablet Take 40 mg by mouth daily.  0  . LYRICA 75 MG capsule Take 75 mg by mouth 3 (three) times daily.  0  . metFORMIN (GLUCOPHAGE-XR) 750 MG 24 hr tablet Take 750 mg by mouth 2 (two) times daily.  0  . omeprazole (PRILOSEC) 20 MG capsule Take 20 mg by mouth 2 (two) times daily.  0  . pioglitazone (ACTOS) 30 MG tablet Take 30 mg by mouth daily.    . traMADol (ULTRAM) 50 MG tablet Take 50 mg by mouth every 6 (six) hours as needed.  0   No current facility-administered medications for this visit.      PHYSICAL EXAMINATION: ECOG PERFORMANCE STATUS: 1 - Symptomatic but completely ambulatory Vitals:   08/25/17 1051  BP: 124/81  Pulse: 94  Temp: 98.8 F (37.1 C)   Filed Weights   08/25/17 1051  Weight: 140 lb 12.2 oz (63.9 kg)    Physical Exam  Constitutional: He is oriented to person, place, and time and well-developed, well-nourished, and in no distress. No distress.  HENT:  Head: Normocephalic and atraumatic.  Mouth/Throat: Oropharynx is clear and moist. No oropharyngeal exudate.  Eyes: Conjunctivae and EOM are normal. Pupils are equal, round, and reactive to light. No scleral icterus.  Neck: Normal range of motion. Neck supple. No JVD present.  Cardiovascular: Normal rate, regular rhythm and normal heart sounds.  No murmur heard. Pulmonary/Chest: Effort normal and breath sounds normal. No respiratory distress. He has no wheezes.  Abdominal: Soft. Bowel sounds are normal. He exhibits no distension. There is no tenderness. There is no rebound.  (+) hernia  Musculoskeletal: Normal range of motion. He exhibits no edema.  Lymphadenopathy:    He has no cervical adenopathy.  Neurological: He is alert and oriented to person, place, and time. No cranial nerve deficit. Gait normal.  Skin: Skin is warm and dry. No rash noted. No erythema.  Psychiatric: Affect and judgment normal.     LABORATORY DATA:  I have reviewed the data as listed Lab Results  Component Value  Date   WBC 5.7 08/25/2017   HGB 15.0 08/25/2017   HCT 43.4 08/25/2017   MCV 87.4 08/25/2017   PLT 418 08/25/2017   Recent Labs    07/21/17 1159  NA 135  K 4.5  CL 100*  CO2 26  GLUCOSE 346*  BUN 21*  CREATININE 0.89  CALCIUM 9.5  GFRNONAA >60  GFRAA >60  PROT 8.1  ALBUMIN 4.6  AST 19  ALT 16*  ALKPHOS 78  BILITOT 0.5       ASSESSMENT & PLAN:  1. Thrombocytosis (HCC)    Iron panel reviewed, less likely iron deficiency. Normal LDH, normal LFT. Flowcytometry negative.  Negative  JAK 2 mutation,  BCR ABL; MPL; CALR mutation.  Thrombocytosis actually resolved today.  # etiology of abdominal pain is unknown.  Patient is currently underwent ongoing workup.  I offer the patient that he can come follow-up with me in 4 months and repeat a CBC test.  If CBC revealed normal platelet count, most likely thrombocytosis is reactive and that he no longer needs follow-up with me anymore.  Or he can follow-up with his primary care physician and repeat another test in 3-4 months.  If platelet increase again, or there is additional concerns, he can give [place] a call and set up an appointment.  Patient opt to follow-up with his primary care physician and repeat test there. All questions were answered. The patient knows to call the clinic with any problems questions or concerns.  Return of visit: as needed.   Earlie Server, MD, PhD Hematology Oncology [facility] Pager- [phone] 08/25/2017

## 2017-08-25 ENCOUNTER — Inpatient Hospital Stay: Payer: BLUE CROSS/BLUE SHIELD | Attending: Oncology | Admitting: Oncology

## 2017-08-25 ENCOUNTER — Other Ambulatory Visit: Payer: Self-pay

## 2017-08-25 ENCOUNTER — Inpatient Hospital Stay: Payer: BLUE CROSS/BLUE SHIELD

## 2017-08-25 ENCOUNTER — Encounter: Payer: Self-pay | Admitting: Oncology

## 2017-08-25 VITALS — BP 124/81 | HR 94 | Temp 98.8°F | Wt 140.8 lb

## 2017-08-25 DIAGNOSIS — D473 Essential (hemorrhagic) thrombocythemia: Secondary | ICD-10-CM | POA: Diagnosis not present

## 2017-08-25 DIAGNOSIS — D75839 Thrombocytosis, unspecified: Secondary | ICD-10-CM

## 2017-08-25 DIAGNOSIS — E119 Type 2 diabetes mellitus without complications: Secondary | ICD-10-CM | POA: Diagnosis not present

## 2017-08-25 DIAGNOSIS — Z7984 Long term (current) use of oral hypoglycemic drugs: Secondary | ICD-10-CM | POA: Diagnosis not present

## 2017-08-25 DIAGNOSIS — Z79899 Other long term (current) drug therapy: Secondary | ICD-10-CM | POA: Insufficient documentation

## 2017-08-25 DIAGNOSIS — K449 Diaphragmatic hernia without obstruction or gangrene: Secondary | ICD-10-CM | POA: Diagnosis not present

## 2017-08-25 DIAGNOSIS — R109 Unspecified abdominal pain: Secondary | ICD-10-CM

## 2017-08-25 DIAGNOSIS — E785 Hyperlipidemia, unspecified: Secondary | ICD-10-CM | POA: Insufficient documentation

## 2017-08-25 LAB — CBC WITH DIFFERENTIAL/PLATELET
BASOS ABS: 0 10*3/uL (ref 0–0.1)
Basophils Relative: 1 %
Eosinophils Absolute: 0.1 10*3/uL (ref 0–0.7)
Eosinophils Relative: 2 %
HEMATOCRIT: 43.4 % (ref 40.0–52.0)
HEMOGLOBIN: 15 g/dL (ref 13.0–18.0)
LYMPHS PCT: 29 %
Lymphs Abs: 1.6 10*3/uL (ref 1.0–3.6)
MCH: 30.3 pg (ref 26.0–34.0)
MCHC: 34.6 g/dL (ref 32.0–36.0)
MCV: 87.4 fL (ref 80.0–100.0)
Monocytes Absolute: 0.7 10*3/uL (ref 0.2–1.0)
Monocytes Relative: 13 %
NEUTROS ABS: 3.1 10*3/uL (ref 1.4–6.5)
NEUTROS PCT: 55 %
PLATELETS: 418 10*3/uL (ref 150–440)
RBC: 4.96 MIL/uL (ref 4.40–5.90)
RDW: 12.1 % (ref 11.5–14.5)
WBC: 5.7 10*3/uL (ref 3.8–10.6)

## 2017-08-25 NOTE — Progress Notes (Signed)
Patient here today for follow up.   

## 2017-08-30 ENCOUNTER — Ambulatory Visit (INDEPENDENT_AMBULATORY_CARE_PROVIDER_SITE_OTHER): Payer: BLUE CROSS/BLUE SHIELD | Admitting: Surgery

## 2017-08-30 ENCOUNTER — Encounter: Payer: Self-pay | Admitting: Surgery

## 2017-08-30 VITALS — BP 156/87 | HR 100 | Temp 98.7°F | Ht 64.0 in | Wt 139.0 lb

## 2017-08-30 DIAGNOSIS — M546 Pain in thoracic spine: Secondary | ICD-10-CM

## 2017-08-30 DIAGNOSIS — G8929 Other chronic pain: Secondary | ICD-10-CM

## 2017-08-30 NOTE — Progress Notes (Signed)
Surgical Clinic History and Physical  Referring provider:  Juluis Pitch, MD [address],  [address]  HISTORY OF PRESENT ILLNESS (HPI):  52 y.o. 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 He reports his pain is worse with vibration, such as driving a forklift or driving over bumps in a car, and with sitting up from laying/reclined position. He denies any association with eating or BM's, denies frequent coughing or straining with urination. During the past 3-4 months, patient says he's also lost 30-40 lbs, attributed to changes in his diabetes medications. He was previously poorly controlled on insulin, but due to changes to his insurance deductible, he has not been able to afford insulin and was switched to multiple oral antihyperglycemic medications. He has been prescribed Tramadol and Lyrica by his PMD, but he and his wife express frustration regarding lack of diagnosis and frequency of running out of pain medications. Despite his weight loss and constipation on abdominal x-rays, patient denies any prior colonoscopy, though likewise denies blood with BM's and normalized BM's since having been prescribed Miralax and lactulose by GI. Since losing 30-40 lbs, patient also describes having noticed Right lateral abdominal fullness/assymmetry.   PAST MEDICAL HISTORY (PMH):  Past Medical History:  Diagnosis Date  . Diabetes mellitus without complication ([place])   . History of hiatal hernia   . Hyperlipidemia      PAST SURGICAL HISTORY ([place]):  Past Surgical History:  Procedure Laterality Date  . APPENDECTOMY       MEDICATIONS:  Prior to Admission medications   Medication Sig Start Date End Date Taking? Authorizing Provider  ALPRAZolam Duanne Moron) 0.5 MG tablet Take 0.5 mg by mouth as needed. 04/18/17  Yes  [provider]  FARXIGA 10 MG TABS tablet Take 10 mg by mouth daily. 06/24/17  Yes [provider]  fluticasone (FLONASE) 50 MCG/ACT nasal spray instill 2 sprays into each nostril once daily 01/14/17  Yes [provider]  gemfibrozil (LOPID) 600 MG tablet Take 600 mg by mouth 2 (two) times daily. 06/20/17  Yes [provider]  glipiZIDE (GLUCOTROL) 10 MG tablet Take 10 mg by mouth 2 (two) times daily. 06/20/17  Yes [provider]  lovastatin (MEVACOR) 40 MG tablet Take 40 mg by mouth daily. 06/22/17  Yes [provider]  LYRICA 75 MG capsule Take 75 mg by mouth 3 (three) times daily. 08/16/17  Yes [provider]  metFORMIN (GLUCOPHAGE-XR) 750 MG 24 hr tablet Take 750 mg by mouth 2 (two) times daily. 06/20/17  Yes [provider]  omeprazole (PRILOSEC) 20 MG capsule Take 20 mg by mouth 2 (two) times daily. 06/20/17  Yes [provider]  pioglitazone (ACTOS) 30 MG tablet Take 30 mg by mouth daily. 05/27/17 05/27/18 Yes [provider]  traMADol (ULTRAM) 50 MG tablet Take 50 mg by mouth every 6 (six) hours as needed. 07/19/17  Yes [provider]     ALLERGIES:  No Known Allergies   SOCIAL HISTORY:  Social History   Socioeconomic History  . Marital status: Married    Spouse name: Not on file  . Number of children: Not on file  . Years of education: Not on file  . Highest education level: Not on file  Social Needs  . Financial resource strain: Not on file  .  Food insecurity - worry: Not on file  . Food insecurity - inability: Not on file  . Transportation needs - medical: Not on file  . Transportation needs - non-medical: Not on file  Occupational History  . Not on file  Tobacco Use  . Smoking status: Never Smoker  . Smokeless tobacco: Never Used  Substance and Sexual Activity  . Alcohol use: No    Frequency: Never  . Drug use: No  . Sexual activity: Not on file  Other Topics Concern   . Not on file  Social History Narrative  . Not on file    The patient currently resides (home / rehab facility / nursing home): Home The patient normally is (ambulatory / bedbound): Ambulatory  FAMILY HISTORY:  Family History  Problem Relation Age of Onset  . Diabetes Mother   . Breast cancer Mother   . Thyroid disease Mother   . Heart disease Mother   . Heart disease Father   . Hypertension Father   . Lupus Sister   . Diabetes Maternal Grandmother   . Heart disease Maternal Grandmother   . Diabetes Maternal Grandfather   . Heart disease Maternal Grandfather     Otherwise negative/non-contributory.  REVIEW OF SYSTEMS:  Constitutional: denies any other weight loss, fever, chills, or sweats  Eyes: denies any other vision changes, history of eye injury  ENT: denies sore throat, hearing problems  Respiratory: denies shortness of breath, wheezing  Cardiovascular: denies chest pain, palpitations  Gastrointestinal: abdominal pain, N/V, and bowel function as per HPI Musculoskeletal: denies any other joint pains or cramps  Skin: Denies any other rashes or skin discolorations Neurological: denies any other headache, dizziness, weakness  Psychiatric: Denies any other depression, anxiety   All other review of systems were otherwise negative   VITAL SIGNS:  BP (!) 156/87   Pulse 100   Temp 98.7 F (37.1 C) (Oral)   Ht 5\' 4"  (1.626 m)   Wt 139 lb (63 kg)   BMI 23.86 kg/m   PHYSICAL EXAM:  Constitutional:  -- Normal body habitus  -- Awake, alert, and oriented x3  Eyes:  -- Pupils equally round and reactive to light  -- No scleral icterus  Ear, nose, throat:  -- No jugular venous distension -- No nasal drainage, bleeding Pulmonary:  -- No crackles  -- Equal breath sounds bilaterally -- Breathing non-labored at rest Cardiovascular:  -- S1, S2 present  -- No pericardial rubs  Gastrointestinal:  -- Abdomen soft, nontender, and non-distended with no guarding/rebound  tenderness  -- No abdominal masses appreciated, pulsatile or otherwise, except non-tender Right lateral abdominal diastasis Musculoskeletal and Integumentary:  -- Wounds or skin discoloration: None appreciated -- Extremities: B/L UE and LE FROM, hands and feet warm  Neurologic:  -- Motor function: Intact and symmetric -- Sensation: Grossly intact and symmetric  Labs: CBC Latest Ref Rng & Units 08/25/2017 07/28/2017 07/21/2017  WBC 3.8 - 10.6 K/uL 5.7 5.6 5.7  Hemoglobin 13.0 - 18.0 g/dL 15.0 14.7 14.9  Hematocrit 40.0 - 52.0 % 43.4 42.6 43.4  Platelets 150 - 440 K/uL 418 462(H) 494(H)   CMP Latest Ref Rng & Units 07/21/2017 11/07/2013  Glucose 65 - 99 mg/dL 346(H) 244(H)  BUN 6 - 20 mg/dL 21(H) 13  Creatinine 0.61 - 1.24 mg/dL 0.89 0.98  Sodium 135 - 145 mmol/L 135 132(L)  Potassium 3.5 - 5.1 mmol/L 4.5 3.9  Chloride 101 - 111 mmol/L 100(L) 99  CO2 22 - 32 mmol/L 26 27  Calcium 8.9 - 10.3 mg/dL 9.5 9.2  Total Protein 6.5 - 8.1 g/dL 8.1 -  Total Bilirubin 0.3 - 1.2 mg/dL 0.5 -  Alkaline Phos 38 - 126 U/L 78 -  AST 15 - 41 U/L 19 -  ALT 17 - 63 U/L 16(L) -   HbA1C (07/26/2017): 10.5  Imaging studies:  CT Abdomen and Pelvis with Contrast (07/06/2017) - personally reviewed and discussed with patient No acute finding to account for abdominal pain.   Complete Renal/Urinary Ultrasound (07/18/2017) No acute abnormality noted.  Assessment/Plan:  52 y.o. male with chronic, though recently variable, Left upper back now > Right upper back pain, complicated by -- though not attributable to -- minimally symptomatic and easily reducible Right inguinal hernia, asymptomatic RUQ abdominal wall diastasis, and co-morbidities including very poorly controlled DM with diabetic neuropathy and possibly also spine-associated neuropathy/paresthesias, HLD, hiatal hernia, and what appears to be undiagnosed generalized anxiety disorder on Xanax and Tramadol.   - strict blood glucose control  - screening  colonoscopy due to patient's age and weight loss  - consider referral to orthopedist, who will likely advise MRI to evaluate spine with paresthesias  - if unremarkable orthopedic workup, may consider rheumatologist for fibromyalgia or otherwise  - will likely benefit from elective repair of easily reducible Right inguinal hernia after above addressed  - return to clinic as needed, instructed to call if any questions or concerns  All of the above recommendations were discussed with the patient and his wife at length, and all of patient's and family's questions were answered to their expressed satisfaction.  Thank you for the opportunity to participate in this patient's care.  -- Marilynne Drivers Rosana Hoes, MD, [place]: [facility] - Partnering for exceptional care. Office: [phone]

## 2017-08-30 NOTE — Patient Instructions (Addendum)
Please try to control your blood sugars.  Please have the Gastroenterologist order a colonoscopy due to your weight loss.  See if your primary care doctor could refer you to Orthopedics and hopefully they order an MRI of your back.  Please give [place] a call in case you have questions or concerns.

## 2017-09-29 ENCOUNTER — Other Ambulatory Visit: Payer: Self-pay | Admitting: Orthopedic Surgery

## 2017-09-29 DIAGNOSIS — M5417 Radiculopathy, lumbosacral region: Secondary | ICD-10-CM

## 2017-09-29 DIAGNOSIS — M545 Low back pain, unspecified: Secondary | ICD-10-CM

## 2017-09-29 DIAGNOSIS — M5136 Other intervertebral disc degeneration, lumbar region: Secondary | ICD-10-CM

## 2017-09-29 DIAGNOSIS — M5440 Lumbago with sciatica, unspecified side: Principal | ICD-10-CM

## 2017-10-07 ENCOUNTER — Ambulatory Visit
Admission: RE | Admit: 2017-10-07 | Discharge: 2017-10-07 | Disposition: A | Discharge status: Home / Self Care | Payer: BLUE CROSS/BLUE SHIELD | Source: Ambulatory Visit | Attending: Orthopedic Surgery | Admitting: Orthopedic Surgery

## 2017-10-07 DIAGNOSIS — M5440 Lumbago with sciatica, unspecified side: Secondary | ICD-10-CM

## 2017-10-07 DIAGNOSIS — M1288 Other specific arthropathies, not elsewhere classified, other specified site: Secondary | ICD-10-CM | POA: Diagnosis not present

## 2017-10-07 DIAGNOSIS — M545 Low back pain, unspecified: Secondary | ICD-10-CM

## 2017-10-07 DIAGNOSIS — M5417 Radiculopathy, lumbosacral region: Secondary | ICD-10-CM | POA: Insufficient documentation

## 2017-10-07 DIAGNOSIS — M5136 Other intervertebral disc degeneration, lumbar region: Secondary | ICD-10-CM | POA: Insufficient documentation

## 2017-10-12 ENCOUNTER — Other Ambulatory Visit: Payer: Self-pay | Admitting: Orthopedic Surgery

## 2017-10-12 DIAGNOSIS — M5136 Other intervertebral disc degeneration, lumbar region: Secondary | ICD-10-CM

## 2017-10-14 ENCOUNTER — Ambulatory Visit
Admission: RE | Admit: 2017-10-14 | Discharge: 2017-10-14 | Disposition: A | Discharge status: Home / Self Care | Payer: BLUE CROSS/BLUE SHIELD | Source: Ambulatory Visit | Attending: Orthopedic Surgery | Admitting: Orthopedic Surgery

## 2017-10-14 DIAGNOSIS — M5136 Other intervertebral disc degeneration, lumbar region: Secondary | ICD-10-CM

## 2017-10-14 MED ORDER — METHYLPREDNISOLONE ACETATE 40 MG/ML INJ SUSP (RADIOLOG
120.0000 mg | Freq: Once | INTRAMUSCULAR | Status: AC
  Administered 2017-10-14: 120 mg via EPIDURAL

## 2017-10-14 MED ORDER — IOPAMIDOL (ISOVUE-M 200) INJECTION 41%
1.0000 mL | Freq: Once | INTRAMUSCULAR | Status: AC
  Administered 2017-10-14: 1 mL via EPIDURAL

## 2017-10-14 NOTE — Discharge Instructions (Signed)

## 2017-12-02 ENCOUNTER — Other Ambulatory Visit: Payer: Self-pay | Admitting: Orthopedic Surgery

## 2017-12-02 DIAGNOSIS — M5440 Lumbago with sciatica, unspecified side: Principal | ICD-10-CM

## 2017-12-02 DIAGNOSIS — M545 Low back pain, unspecified: Secondary | ICD-10-CM

## 2017-12-02 DIAGNOSIS — M5136 Other intervertebral disc degeneration, lumbar region: Secondary | ICD-10-CM

## 2017-12-02 DIAGNOSIS — M5417 Radiculopathy, lumbosacral region: Secondary | ICD-10-CM

## 2017-12-12 ENCOUNTER — Ambulatory Visit
Admission: RE | Admit: 2017-12-12 | Discharge: 2017-12-12 | Disposition: A | Discharge status: Home / Self Care | Payer: BLUE CROSS/BLUE SHIELD | Source: Ambulatory Visit | Attending: Orthopedic Surgery | Admitting: Orthopedic Surgery

## 2017-12-12 DIAGNOSIS — M5440 Lumbago with sciatica, unspecified side: Principal | ICD-10-CM

## 2017-12-12 DIAGNOSIS — M5417 Radiculopathy, lumbosacral region: Secondary | ICD-10-CM

## 2017-12-12 DIAGNOSIS — M545 Low back pain, unspecified: Secondary | ICD-10-CM

## 2017-12-12 DIAGNOSIS — M5136 Other intervertebral disc degeneration, lumbar region: Secondary | ICD-10-CM

## 2017-12-12 MED ORDER — METHYLPREDNISOLONE ACETATE 40 MG/ML INJ SUSP (RADIOLOG
120.0000 mg | Freq: Once | INTRAMUSCULAR | Status: AC
  Administered 2017-12-12: 120 mg via EPIDURAL

## 2017-12-12 MED ORDER — IOPAMIDOL (ISOVUE-M 200) INJECTION 41%
1.0000 mL | Freq: Once | INTRAMUSCULAR | Status: AC
  Administered 2017-12-12: 1 mL via EPIDURAL

## 2017-12-12 NOTE — Discharge Instructions (Signed)

## 2018-01-16 ENCOUNTER — Other Ambulatory Visit: Payer: Self-pay | Admitting: Orthopedic Surgery

## 2018-01-16 DIAGNOSIS — G8929 Other chronic pain: Secondary | ICD-10-CM

## 2018-01-16 DIAGNOSIS — M545 Low back pain, unspecified: Secondary | ICD-10-CM

## 2018-01-16 DIAGNOSIS — M5440 Lumbago with sciatica, unspecified side: Principal | ICD-10-CM

## 2018-01-16 DIAGNOSIS — M546 Pain in thoracic spine: Secondary | ICD-10-CM

## 2018-01-21 ENCOUNTER — Ambulatory Visit
Admission: RE | Admit: 2018-01-21 | Discharge: 2018-01-21 | Disposition: A | Discharge status: Home / Self Care | Payer: BLUE CROSS/BLUE SHIELD | Source: Ambulatory Visit | Attending: Orthopedic Surgery | Admitting: Orthopedic Surgery

## 2018-01-21 DIAGNOSIS — M545 Low back pain, unspecified: Secondary | ICD-10-CM

## 2018-01-21 DIAGNOSIS — G8929 Other chronic pain: Secondary | ICD-10-CM

## 2018-01-21 DIAGNOSIS — M546 Pain in thoracic spine: Secondary | ICD-10-CM | POA: Diagnosis not present

## 2018-01-21 DIAGNOSIS — M5440 Lumbago with sciatica, unspecified side: Secondary | ICD-10-CM

## 2018-01-21 DIAGNOSIS — M50223 Other cervical disc displacement at C6-C7 level: Secondary | ICD-10-CM | POA: Diagnosis not present

## 2018-02-08 ENCOUNTER — Other Ambulatory Visit: Payer: Self-pay | Admitting: Orthopedic Surgery

## 2018-02-08 DIAGNOSIS — G8929 Other chronic pain: Secondary | ICD-10-CM

## 2018-02-08 DIAGNOSIS — M5412 Radiculopathy, cervical region: Secondary | ICD-10-CM

## 2018-02-08 DIAGNOSIS — M546 Pain in thoracic spine: Secondary | ICD-10-CM

## 2018-02-24 ENCOUNTER — Ambulatory Visit
Admission: RE | Admit: 2018-02-24 | Discharge: 2018-02-24 | Disposition: A | Discharge status: Home / Self Care | Payer: BLUE CROSS/BLUE SHIELD | Source: Ambulatory Visit | Attending: Orthopedic Surgery | Admitting: Orthopedic Surgery

## 2018-02-24 DIAGNOSIS — G8929 Other chronic pain: Secondary | ICD-10-CM | POA: Diagnosis present

## 2018-02-24 DIAGNOSIS — M5412 Radiculopathy, cervical region: Secondary | ICD-10-CM | POA: Diagnosis present

## 2018-02-24 DIAGNOSIS — M546 Pain in thoracic spine: Secondary | ICD-10-CM | POA: Diagnosis present

## 2018-02-24 DIAGNOSIS — M4722 Other spondylosis with radiculopathy, cervical region: Secondary | ICD-10-CM | POA: Insufficient documentation

## 2018-02-24 DIAGNOSIS — M2578 Osteophyte, vertebrae: Secondary | ICD-10-CM | POA: Diagnosis not present

## 2018-03-14 ENCOUNTER — Other Ambulatory Visit: Payer: Self-pay | Admitting: Orthopedic Surgery

## 2018-03-14 DIAGNOSIS — M546 Pain in thoracic spine: Secondary | ICD-10-CM

## 2018-03-14 DIAGNOSIS — M5412 Radiculopathy, cervical region: Secondary | ICD-10-CM

## 2018-03-14 DIAGNOSIS — G8929 Other chronic pain: Secondary | ICD-10-CM

## 2018-03-23 ENCOUNTER — Ambulatory Visit
Admission: RE | Admit: 2018-03-23 | Discharge: 2018-03-23 | Disposition: A | Discharge status: Home / Self Care | Payer: BLUE CROSS/BLUE SHIELD | Source: Ambulatory Visit | Attending: Orthopedic Surgery | Admitting: Orthopedic Surgery

## 2018-03-23 DIAGNOSIS — M5412 Radiculopathy, cervical region: Secondary | ICD-10-CM

## 2018-03-23 MED ORDER — TRIAMCINOLONE ACETONIDE 40 MG/ML IJ SUSP (RADIOLOGY)
60.0000 mg | Freq: Once | INTRAMUSCULAR | Status: AC
  Administered 2018-03-23: 60 mg via EPIDURAL

## 2018-03-23 MED ORDER — IOPAMIDOL (ISOVUE-M 300) INJECTION 61%
1.0000 mL | Freq: Once | INTRAMUSCULAR | Status: AC | PRN
  Administered 2018-03-23: 1 mL via EPIDURAL

## 2018-03-23 NOTE — Discharge Instructions (Signed)

## 2018-05-16 ENCOUNTER — Other Ambulatory Visit: Payer: Self-pay | Admitting: Orthopedic Surgery

## 2018-05-16 DIAGNOSIS — M5412 Radiculopathy, cervical region: Secondary | ICD-10-CM

## 2018-05-30 ENCOUNTER — Ambulatory Visit
Admission: RE | Admit: 2018-05-30 | Discharge: 2018-05-30 | Disposition: A | Discharge status: Home / Self Care | Payer: BLUE CROSS/BLUE SHIELD | Source: Ambulatory Visit | Attending: Orthopedic Surgery | Admitting: Orthopedic Surgery

## 2018-05-30 DIAGNOSIS — M5412 Radiculopathy, cervical region: Secondary | ICD-10-CM

## 2018-05-30 MED ORDER — TRIAMCINOLONE ACETONIDE 40 MG/ML IJ SUSP (RADIOLOGY)
60.0000 mg | Freq: Once | INTRAMUSCULAR | Status: AC
  Administered 2018-05-30: 60 mg via EPIDURAL

## 2018-05-30 MED ORDER — IOPAMIDOL (ISOVUE-M 300) INJECTION 61%
1.0000 mL | Freq: Once | INTRAMUSCULAR | Status: AC | PRN
  Administered 2018-05-30: 1 mL via EPIDURAL

## 2018-05-30 NOTE — Discharge Instructions (Signed)

## 2018-10-18 ENCOUNTER — Ambulatory Visit: Payer: BLUE CROSS/BLUE SHIELD | Admitting: Nurse Practitioner

## 2019-01-06 IMAGING — XA DG INJECT/[PERSON_NAME] INC NEEDLE/CATH/PLC EPI/CERV/THOR W/IMG
2 series · 2 of 2 positions shown · non-contrast
Comparison: none

CLINICAL DATA: Cervical radiculopathy. Pain extending from the neck
along the paraspinous region through the thoracic spine into the
ribs bilaterally. C6-7 cervical disc disease.

[Series 1: ortho standard · 1 of 1 slices shown (1 of 2)]
[im 1/1]
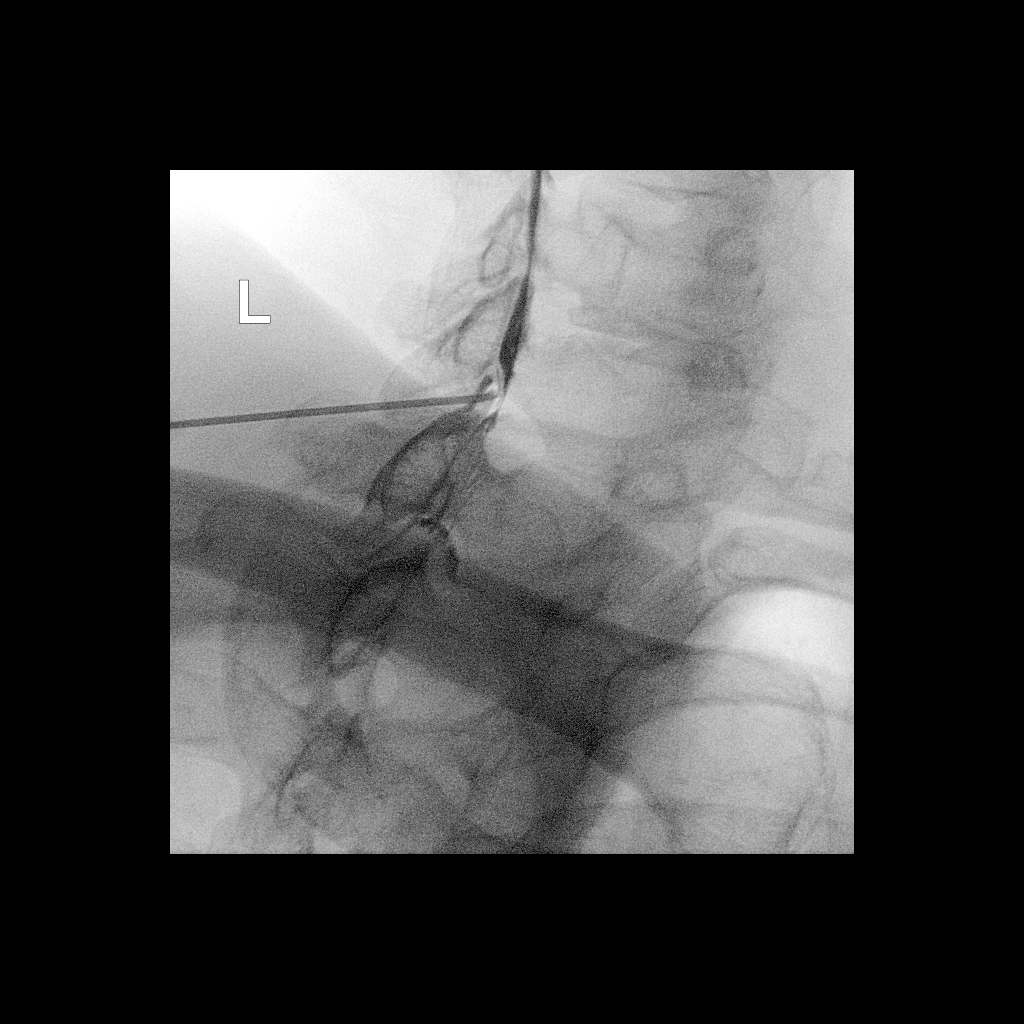

[Series 2: ortho standard · 1 of 1 slices shown (2 of 2)]
[im 1/1]
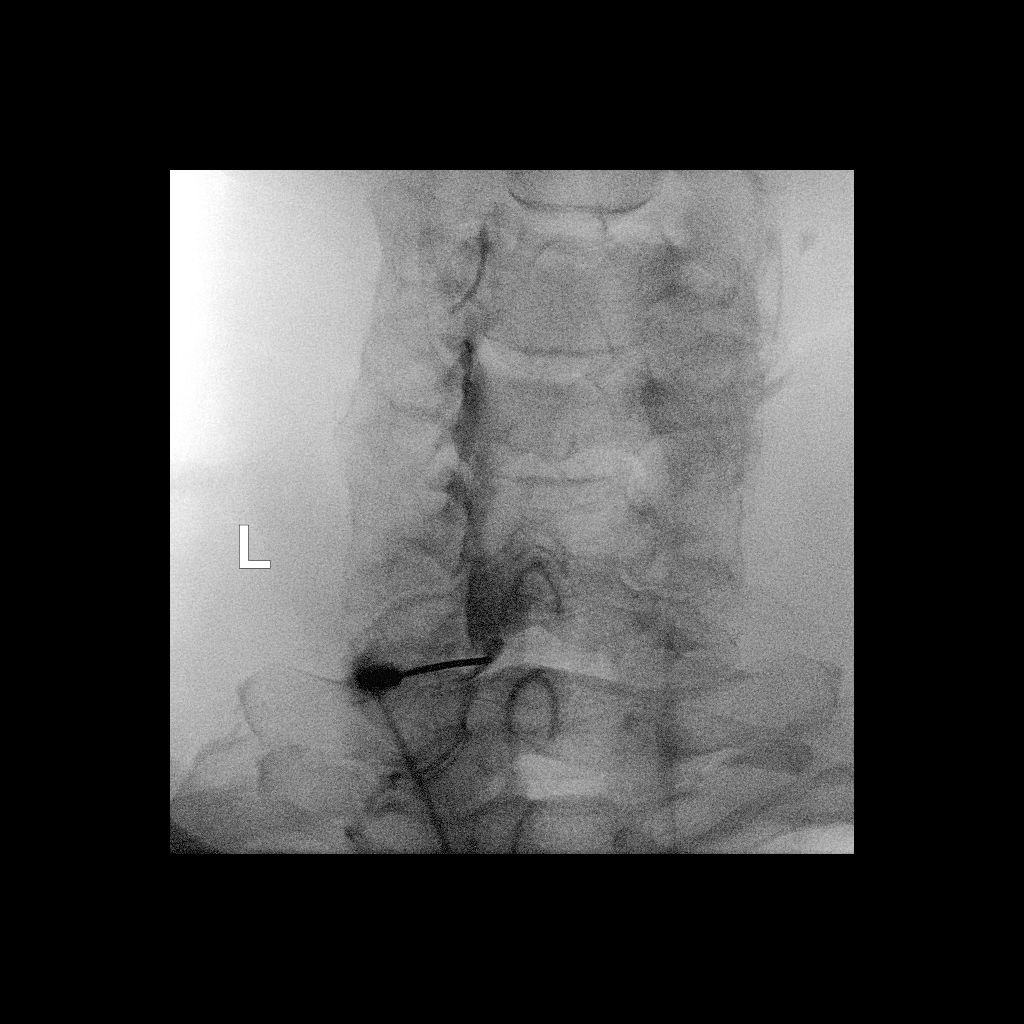

[2 of 2 positions shown; findings below may reference images not displayed]

FLUOROSCOPY TIME:  Radiation Exposure Index (as provided by the
fluoroscopic device): 8.83 uGy*m2

Fluoroscopy Time:  37 seconds

Number of Acquired Images:  0

PROCEDURE:
CERVICAL EPIDURAL INJECTION

An interlaminar approach was performed on the left at C7-T1. A 20
gauge epidural needle was advanced using loss-of-resistance
technique.

DIAGNOSTIC EPIDURAL INJECTION

Injection of Isovue-M 300 shows a good epidural pattern with spread
above and below the level of needle placement, primarily on the
left. No vascular opacification is seen. THERAPEUTIC

EPIDURAL INJECTION

1.5 ml of Kenalog 40 mixed with 1 ml of 1% Lidocaine and 2 ml of
normal saline were then instilled. The procedure was well-tolerated,
and the patient was discharged thirty minutes following the
injection in good condition.
IMPRESSION: Technically successful third epidural injection on the left at
C7-T1.

## 2019-01-15 NOTE — Progress Notes (Signed)
Patient's Name: Luke Russo  MRN: [id number]  Referring Provider: Meade Maw, MD  DOB: 1965-09-03  PCP: Juluis Pitch, MD  DOS: 01/18/2019  Note by: Gillis Santa, MD  Service setting: Ambulatory outpatient  Specialty: Interventional Pain Management  Location: [facility] Virtual Visit  Visit type: Initial Patient Evaluation  Patient type: New Patient   Pain Management Virtual Encounter Note - Virtual Visit via [place] (real-time audio visits between healthcare provider and patient).   Patient's Phone No.:  [phone] (home); [phone] (mobile); (Preferred) [phone] [email or web address]  [facility] [address] [place], [place] - [address] [address] Phone: [phone] Fax: [phone]  [id number] [address], [place] [address] Phone: [phone] Fax: [phone]    Pre-screening note:  Our staff contacted Luke Russo and offered him an "in person", "face-to-face" appointment versus a telephone encounter. He indicated preferring the telephone encounter, at this time.  Primary Reason(s) for Visit: Tele-Encounter for initial evaluation of one or more chronic problems (new to examiner) potentially causing chronic pain, and posing a threat to normal musculoskeletal function. (Level of risk: High) CC: Other (rib bilateral)  I contacted Luke Russo on 01/18/2019 via video conference.      I clearly identified myself as Gillis Santa, MD. I verified that I was speaking with the correct person using two identifiers (Name: WALLER MARCUSSEN, and date of birth: 09-Dec-1965).  Advanced Informed Consent I sought verbal advanced consent from Luke Russo for virtual visit interactions. I informed Luke Russo of possible security and privacy concerns, risks, and limitations associated with providing  "not-in-person" medical evaluation and management services. I also informed Luke Russo of the availability of "in-person" appointments. Finally, I informed him that there would be a charge for the virtual visit and that he could be  personally, fully or partially, financially responsible for it. Luke Russo expressed understanding and agreed to proceed.   HPI  Mr. Luke Russo is a 53 y.o. year old, male patient, contacted today for an initial evaluation of his chronic pain. He has Diabetes mellitus type 2, uncomplicated ([place]); H/O hiatal hernia; Hyperlipidemia; Intercostal neuralgia; Rib pain (bilteral, no trauma, inciting event); Cervical spondylosis; Cervical radiculopathy at C7; and Chronic pain syndrome on their problem list.  Pain Assessment: Location:   Rib cage Radiating: denies Onset: More than a month ago Duration: Chronic pain Quality: Aching, Constant, Dull, Tightness Severity: 7 /10 (subjective, self-reported pain score)  Effect on ADL: limiots activities Timing: Constant Modifying factors: rocking in a chair  Onset and Duration: Gradual and Present longer than 3 months Cause of pain: Unknown Severity: Getting worse, NAS-11 at its worse: 10/10, NAS-11 at its best: 2/10, NAS-11 now: 7/10 and NAS-11 on the average: 7/10 Timing: Morning and After a period of immobility Aggravating Factors: Lifiting, Motion, Twisting and inactivity Alleviating Factors: Medications and rocking in a chair Associated Problems: Nausea Quality of Pain: Annoying, Intermittent, Deep, Distressing, Nagging, Numb, Pressure-like, Shooting, Sickening, Stabbing, Superficial, Tender and Throbbing Previous Examinations or Tests: Endoscopy, MRI scan, Neurological evaluation, Neurosurgical evaluation and Orthopedic evaluation Previous Treatments: Epidural steroid injections, Narcotic medications and Physical Therapy  Chief complaint of persistent bilateral rib pain that is been present for approximately 2 years.  No  inciting or traumatic event.  No history of prior rib fractures.  He has been referred from [facility]  cl[facility].  Previously patient also had cervical radicular issues for which she received cervical epidural steroid injection at the [facility] clinic which he states helped with his neck and arm pain.  Patient states that his pain is present every single day and is very debilitating and makes it difficult for him to perform activities of daily living.  Describes the majority of his pain along his anterior ribs.  He states that he has difficulty sleeping at night as a result of the pain.  Patient has had a total spine MRI including cervical, thoracic, lumbar with the results of which are below.  He denies any bowel or bladder dysfunction.  Patient's pain is currently managed on tramadol 100 mg twice daily.  Is also being referred here for medication management as he states that [facility] prefers not to continue prescribing him tramadol.  Denies any compliance issues.  Denies any drug abuse.  Patient has had cervical and lumbar epidural steroid injections in the past for symptoms of cervical and lumbar radicular pain respectively.  The patient was informed that my practice is divided into two sections: an interventional pain management section, as well as a completely separate and distinct medication management section. I explained that I have procedure days for my interventional therapies, and evaluation days for follow-ups and medication management. Because of the amount of documentation required during both, they are kept separated. This means that there is the possibility that he may be scheduled for a procedure on one day, and medication management the next. I have also informed him that because of staffing and facility limitations, I no longer take patients for medication management only. To illustrate the reasons for this, I gave the patient the example of surgeons, and how inappropriate it would be to refer  a patient to his/her care, just to write for the post-surgical antibiotics on a surgery done by a different surgeon.   Because interventional pain management is my board-certified specialty, the patient was informed that joining my practice means that they are open to any and all interventional therapies. I made it clear that this does not mean that they will be forced to have any procedures done. What this means is that I believe interventional therapies to be essential part of the diagnosis and proper management of chronic pain conditions. Therefore, patients not interested in these interventional alternatives will be better served under the care of a different practitioner.  The patient was also made aware of my Comprehensive Pain Management Safety Guidelines where by joining my practice, they limit all of their nerve blocks and joint injections to those done by our practice, for as long as we are retained to manage their care.   Historic Controlled Substance Pharmacotherapy Review   01/10/2019  2   01/10/2019  Tramadol Hcl 50 MG Tablet  30.00 8 [address]   [id number]   [facility] ([id number])   0  18.75 MME  Comm Ins   [place]    Medications: Bottles not available for inspection. Pharmacodynamics: Desired effects: Analgesia: The patient reports <50% benefit. Reported improvement in function: The patient reports medication allows him to accomplish basic ADLs. Clinically meaningful improvement in function (CMIF): Sustained CMIF goals met Perceived effectiveness: Described as relatively effective but with some room for improvement Undesirable effects: Side-effects or Adverse reactions: None reported Historical Monitoring: The patient  reports no history of drug use. List of all UDS Test(s): No results found for: MDMA, COCAINSCRNUR, [place], [place], CANNABQUANT, [place], [place] List of other Serum/Urine  Drug Screening Test(s):  No results found for: AMPHSCRSER, BARBSCRSER, BENZOSCRSER, COCAINSCRSER, COCAINSCRNUR,  PCPSCRSER, PCPQUANT, THCSCRSER, THCU, CANNABQUANT, OPIATESCRSER, OXYSCRSER, PROPOXSCRSER, ETH Historical Background Evaluation: [place] PMP: PDMP reviewed during this encounter. Six (6) year initial data search conducted.             [place] Department of public safety, offender search: Editor, commissioning Information) Non-contributory Risk Assessment Profile: Aberrant behavior: None observed or detected today Risk factors for fatal opioid overdose: None identified today Fatal overdose hazard ratio (HR): Calculation deferred Non-fatal overdose hazard ratio (HR): Calculation deferred Risk of opioid abuse or dependence: 0.7-3.0% with doses ? 36 MME/day and 6.1-26% with doses ? 120 MME/day. Substance use disorder (SUD) risk level: See below Personal History of Substance Abuse (SUD-Substance use disorder):  Alcohol: Negative  Illegal Drugs: Negative  Rx Drugs: Negative  ORT Risk Level calculation: Low Risk Opioid Risk Tool - 01/17/19 1105      Family History of Substance Abuse   Alcohol  Negative    Illegal Drugs  Negative    Rx Drugs  Negative      Personal History of Substance Abuse   Alcohol  Negative    Illegal Drugs  Negative    Rx Drugs  Negative      Age   Age between 51-45 years   No      History of Preadolescent Sexual Abuse   History of Preadolescent Sexual Abuse  Negative or Male      Psychological Disease   Psychological Disease  Negative    Depression  Negative      Total Score   Opioid Risk Tool Scoring  0    Opioid Risk Interpretation  Low Risk      ORT Scoring interpretation table:  Score <3 = Low Risk for SUD  Score between 4-7 = Moderate Risk for SUD  Score >8 = High Risk for Opioid Abuse   Pharmacologic Plan: As per protocol, I have not taken over any controlled substance management, pending the results of ordered tests and/or consults.            Initial impression: Pending review of available data and ordered tests.  Meds   Current Outpatient Medications:  .   ALPRAZolam (XANAX) 0.5 MG tablet, Take 0.5 mg by mouth as needed., Disp: , Rfl: 0 .  Dulaglutide 1.5 MG/0.5ML SOPN, Inject into the skin., Disp: , Rfl:  .  fluticasone (FLONASE) 50 MCG/ACT nasal spray, instill 2 sprays into each nostril once daily, Disp: , Rfl:  .  gemfibrozil (LOPID) 600 MG tablet, Take 600 mg by mouth 2 (two) times daily., Disp: , Rfl: 0 .  insulin aspart protamine - aspart (NOVOLOG MIX 70/30 FLEXPEN) (70-30) 100 UNIT/ML FlexPen, Inject 15 units before breakfast meal. Inject 20 units before supper meal., Disp: , Rfl:  .  lovastatin (MEVACOR) 40 MG tablet, Take 40 mg by mouth daily., Disp: , Rfl: 0 .  meloxicam (MOBIC) 15 MG tablet, , Disp: , Rfl:  .  metFORMIN (GLUCOPHAGE-XR) 750 MG 24 hr tablet, Take 750 mg by mouth 2 (two) times daily., Disp: , Rfl: 0 .  naloxone (NARCAN) nasal spray 4 mg/0.1 mL, Place into the nose., Disp: , Rfl:  .  omeprazole (PRILOSEC) 20 MG capsule, Take 20 mg by mouth 2 (two) times daily., Disp: , Rfl: 0 .  ondansetron (ZOFRAN) 4 MG tablet, Take by mouth., Disp: , Rfl:  .  tiZANidine (ZANAFLEX) 4 MG tablet, , Disp: , Rfl:  .  traMADol (  ULTRAM) 50 MG tablet, Take 50 mg by mouth every 6 (six) hours as needed., Disp: , Rfl: 0 .  vitamin B-12 (CYANOCOBALAMIN) 1000 MCG tablet, Take by mouth., Disp: , Rfl:  .  FARXIGA 10 MG TABS tablet, Take 10 mg by mouth daily., Disp: , Rfl: 1 .  glipiZIDE (GLUCOTROL) 10 MG tablet, Take 10 mg by mouth 2 (two) times daily., Disp: , Rfl: 0 .  LYRICA 75 MG capsule, Take 75 mg by mouth 3 (three) times daily., Disp: , Rfl: 0 .  pioglitazone (ACTOS) 30 MG tablet, Take 30 mg by mouth daily., Disp: , Rfl:  Of note, last Xanax fill was 09/27/2017. ROS  Cardiovascular: High blood pressure Pulmonary or Respiratory: No reported pulmonary signs or symptoms such as wheezing and difficulty taking a deep full breath (Asthma), difficulty blowing air out (Emphysema), coughing up mucus (Bronchitis), persistent dry cough, or temporary stoppage  of breathing during sleep Neurological: No reported neurological signs or symptoms such as seizures, abnormal skin sensations, urinary and/or fecal incontinence, being born with an abnormal open spine and/or a tethered spinal cord Review of Past Neurological Studies: No results found for this or any previous visit. Psychological-Psychiatric: No reported psychological or psychiatric signs or symptoms such as difficulty sleeping, anxiety, depression, delusions or hallucinations (schizophrenial), mood swings (bipolar disorders) or suicidal ideations or attempts Gastrointestinal: Reflux or heatburn Genitourinary: Passing kidney stones Hematological: No reported hematological signs or symptoms such as prolonged bleeding, low or poor functioning platelets, bruising or bleeding easily, hereditary bleeding problems, low energy levels due to low hemoglobin or being anemic Endocrine: High blood sugar requiring insulin (IDDM) Rheumatologic: No reported rheumatological signs and symptoms such as fatigue, joint pain, tenderness, swelling, redness, heat, stiffness, decreased range of motion, with or without associated rash Musculoskeletal: Negative for myasthenia gravis, muscular dystrophy, multiple sclerosis or malignant hyperthermia Work History: Unemployed  Allergies  Mr. Paredez has No Known Allergies.  Laboratory Chemistry   SAFETY SCREENING Profile No results found for: SARSCOV2NAA, COVIDSOURCE, STAPHAUREUS, MRSAPCR, HCVAB, HIV, PREGTESTUR Inflammation Markers (CRP: Acute Phase) (ESR: Chronic Phase) No results found for: CRP, ESRSEDRATE, LATICACIDVEN                       Rheumatology Markers No results found for: RF, ANA, LABURIC, URICUR, LYMEIGGIGMAB, LYMEABIGMQN, HLAB27                      Renal Function Markers Lab Results  Component Value Date   BUN 21 (H) 07/21/2017   CREATININE 0.89 07/21/2017   GFRAA >60 07/21/2017   GFRNONAA >60 07/21/2017                             Hepatic  Function Markers Lab Results  Component Value Date   AST 19 07/21/2017   ALT 16 (L) 07/21/2017   ALBUMIN 4.6 07/21/2017   ALKPHOS 78 07/21/2017                        Electrolytes Lab Results  Component Value Date   NA 135 07/21/2017   K 4.5 07/21/2017   CL 100 (L) 07/21/2017   CALCIUM 9.5 07/21/2017                        Neuropathy Markers No results found for: VITAMINB12, FOLATE, HGBA1C, HIV  CNS Tests No results found for: COLORCSF, APPEARCSF, RBCCOUNTCSF, WBCCSF, POLYSCSF, LYMPHSCSF, EOSCSF, PROTEINCSF, GLUCCSF, JCVIRUS, CSFOLI, IGGCSF, LABACHR, ACETBL                      Bone Pathology Markers No results found for: VD25OH, [id number], [id number], [id number], 25OHVITD1, 25OHVITD2, 25OHVITD3, TESTOFREE, TESTOSTERONE                       Coagulation Parameters Lab Results  Component Value Date   INR 1.0 11/07/2013   LABPROT 12.6 11/07/2013   PLT 418 08/25/2017                        Cardiovascular Markers Lab Results  Component Value Date   BNP 8 11/07/2013   CKMB 0.8 11/08/2013   TROPONINI < 0.02 11/08/2013   HGB 15.0 08/25/2017   HCT 43.4 08/25/2017                         ID Test(s) No results found for: LYMEIGGIGMAB, HIV, SARSCOV2NAA, STAPHAUREUS, MRSAPCR, HCVAB, PREGTESTUR, MICROTEXT  CA Markers No results found for: CEA, CA125, LABCA2                      Endocrine Markers No results found for: TSH, FREET4, TESTOFREE, TESTOSTERONE, SHBG, ESTRADIOL, ESTRADIOLPCT, ESTRADIOLFRE, LABPREG, ACTH                      Note: Lab results reviewed.  Imaging Review  Cervical Imaging: Cervical MR wo contrast:  Results for orders placed during the hospital encounter of 02/24/18  MR CERVICAL SPINE WO CONTRAST   Narrative CLINICAL DATA:  Cervical radiculopathy. History of right-sided disease at C6-7.  EXAM: MRI CERVICAL SPINE WITHOUT CONTRAST  TECHNIQUE: Multiplanar, multisequence MR imaging of the cervical spine was performed. No  intravenous contrast was administered.  COMPARISON:  None.  FINDINGS: Alignment: Normal  Vertebrae: No primary bone lesion. Chronic discogenic marrow changes at C6-7.  Cord: No cord compression or primary cord lesion.  Posterior Fossa, vertebral arteries, paraspinal tissues: Negative  Disc levels:  No abnormality at the foramen magnum, C1-2, C2-3 or C3-4.  C4-5: Minimal disc bulge. Facet degeneration and hypertrophy on the right with some edema. No canal stenosis. Foraminal narrowing on the right could affect the C5 nerve. The facet arthropathy could be a cause of pain.  C5-6: Endplate osteophytes and mild bulging of the disc. Mild facet osteoarthritis on the right. Mild right foraminal narrowing but without likely compressive stenosis.  C6-7: Spondylosis with endplate osteophytes and bulging disc material. Narrowing of the ventral subarachnoid space but no compression of the cord. Bilateral foraminal stenosis right worse than left. Either C7 nerve could be affected.  C7-T1: Normal interspace.  IMPRESSION: C4-5: Right-sided predominant facet arthropathy with edema. This could be a painful articulation. Osteophytic encroachment upon the foramen on the right could affect the exiting right C5 nerve.  C5-6: Spondylosis with mild facet degeneration on the right. No likely compressive stenosis.  C6-7: Spondylosis with endplate osteophytes and bulging disc material resulting in bilateral foraminal encroachment, worse on the right. Either C7 nerve could be affected.   Electronically Signed   By: Nelson Chimes M.D.   On: 02/24/2018 16:11    Thoracic Imaging: Thoracic MR wo contrast:  Results for orders placed during the hospital encounter of 01/21/18  MR THORACIC SPINE WO CONTRAST  Narrative CLINICAL DATA:  53 y/o  M; 9 months of anterior pain of the ribs.  EXAM: MRI THORACIC SPINE WITHOUT CONTRAST  TECHNIQUE: Multiplanar, multisequence MR imaging of the thoracic  spine was performed. No intravenous contrast was administered.  COMPARISON:  07/08/2017 thoracic spine radiographs.  FINDINGS: Alignment:  Physiologic.  Vertebrae: No fracture, evidence of discitis, or bone lesion. T3 vertebral body subcentimeter T1/T2 hyperintense focus compatible with hemangioma.  Cord:  Normal signal and morphology.  Paraspinal and other soft tissues: Negative.  Disc levels:  No significant disc displacement, foraminal stenosis, or canal stenosis of the thoracic spine.  C6-7 right subarticular and foraminal disc protrusion which may contact the exiting right C7 nerve roots.  IMPRESSION: 1. No acute osseous or cord signal abnormality of the thoracic spine. 2. No significant disc displacement, foraminal stenosis, or canal stenosis of the thoracic spine. 3. Partially visualized right C6-7 subarticular and foraminal disc protrusion which may contact the exiting right C7 nerve root.   Electronically Signed   By: Kristine Garbe M.D.   On: 01/21/2018 19:58   Thoracic DG 2-3 views:  Results for orders placed during the hospital encounter of 07/08/17  DG Thoracic Spine 2 View   Narrative CLINICAL DATA:  Posterior rib pain.  No known injury.  EXAM: THORACIC SPINE 2 VIEWS  COMPARISON:  None.  FINDINGS: There is no evidence of thoracic spine fracture. Alignment is normal. No other significant bone abnormalities are identified.  IMPRESSION: Negative.   Electronically Signed   By: Staci Righter M.D.   On: 07/08/2017 13:46    Lumbosacral Imaging: Lumbar MR wo contrast:  Results for orders placed during the hospital encounter of 10/07/17  MR LUMBAR SPINE WO CONTRAST   Narrative CLINICAL DATA:  Right greater than left upper leg pain and abdominal pain. Low back pain.  EXAM: MRI LUMBAR SPINE WITHOUT CONTRAST  TECHNIQUE: Multiplanar, multisequence MR imaging of the lumbar spine was performed. No intravenous contrast was  administered.  COMPARISON:  07/06/2017 CT  FINDINGS: Segmentation: The lowest lumbar type non-rib-bearing vertebra is labeled as L5.  Alignment:  No vertebral subluxation is observed.  Vertebrae:  Unremarkable  Conus medullaris and cauda equina: Conus extends to the L1 level. Conus and cauda equina appear normal.  Paraspinal and other soft tissues: Infiltrative edema signal in the left longissimus thoracis muscle at the L5 level.  Disc levels:  L1-2: Unremarkable.  L2-3: Unremarkable.  L3-4: Unremarkable.  L4-5: Mild bilateral subarticular lateral recess stenosis and mild left foraminal stenosis due to disc bulge and mild left facet arthropathy.  L5-S1: Unremarkable.  IMPRESSION: 1. Degenerative disc disease and facet arthropathy cause mild impingement at L4-5 eccentric to the left. 2. Low-level infiltrative edema signal in the left longissimus thoracis muscle at the L5 level.   Electronically Signed   By: Van Clines M.D.   On: 10/07/2017 13:50    Complexity Note: Imaging results reviewed. Results shared with Mr. Kegley, using Layman's terms.                         [place]  Drug: Mr. Langdon  reports no history of drug use. Alcohol:  reports no history of alcohol use. Tobacco:  reports that he has never smoked. He has never used smokeless tobacco. Medical:  has a past medical history of Diabetes mellitus without complication ([place]), History of hiatal hernia, and Hyperlipidemia. Family: family history includes Breast cancer in his mother; Diabetes in his maternal grandfather, maternal  grandmother, and mother; Heart disease in his father, maternal grandfather, maternal grandmother, and mother; Hypertension in his father; Lupus in his sister; Thyroid disease in his mother.  Past Surgical History:  Procedure Laterality Date  . APPENDECTOMY     Active Ambulatory Problems    Diagnosis Date Noted  . Diabetes mellitus type 2, uncomplicated ([place]) 40/98/1191   . H/O hiatal hernia 07/11/2015  . Hyperlipidemia 07/23/2014  . 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 Rib pain (bilteral, no trauma, inciting event) 01/18/2019  . Cervical spondylosis 01/18/2019  . Cervical radiculopathy at C7 01/18/2019  . Chronic pain syndrome 01/18/2019   Resolved Ambulatory Problems    Diagnosis Date Noted  . No Resolved Ambulatory Problems   Past Medical History:  Diagnosis Date  . Diabetes mellitus without complication ([place])   . History of hiatal hernia    Assessment  Primary Diagnosis & Pertinent Problem List: Diagnoses of Intercostal neuralgia, Rib pain (bilteral, no trauma, inciting event), Cervical spondylosis, Cervical radiculopathy at C7, and Chronic pain syndrome were pertinent to this visit.  Visit Diagnosis (New problems to examiner): 1. Intercostal neuralgia   2. Rib pain (bilteral, no trauma, inciting event)   3. Cervical spondylosis   4. Cervical radiculopathy at C7   5. Chronic pain syndrome   General Recommendations: The pain condition that the patient suffers from is best treated with a multidisciplinary approach that involves an increase in physical activity to prevent de-conditioning and worsening of the pain cycle, as well as psychological counseling (formal and/or informal) to address the co-morbid psychological affects of pain. Treatment will often involve judicious use of pain medications and interventional procedures to decrease the pain, allowing the patient to participate in the physical activity that will ultimately produce long-lasting pain reductions. The goal of the multidisciplinary approach is to return the patient to a higher level of overall function and to restore their ability to perform activities of daily living.  53 year old male with a chief complaint of chronic persistent anterior rib pain that is been present for approximately 2 years without any inciting or traumatic event.  Patient has had total spine MRI results of which  are above.  Patient does have multilevel cervical spondylosis as well as cervical radiculopathy at C7 for which she has received cervical epidural steroid injection for his neck pain and associated arm pain which he states was helpful for a short period of time.  He states that his biggest pain complaint now is his rib pain.  Thoracic MRI was largely unremarkable.  Patient denies any history of rib fractures.  Differential diagnosis includes intercostal neuralgia, thoracic spondylosis.  Start with diagnostic intercostal nerve block bilaterally, levels to be determined on day of procedure after physical exam.  If intercostal nerve block is effective, can consider pulsed intercostal nerve radiofrequency ablation.  Future considerations could also include diagnostic thoracic facet medial branch nerve blocks.  It is unlikely that his cervical spondylosis and cervical radicular pain is contributing to his anterior rib pain.  Regards to medication management, in order to be considered for chronic opioid therapy at our clinic, patient will need baseline urine toxicology screen as well as pain psych evaluation for risk of substance abuse disorder.  Pending results of these studies, can consider taking patient on for chronic opioid therapy.  Patient instructed to continue with his multimodal analgesics which include Lyrica 75 mg 3 times daily, Mobic 15 mg daily, tizanidine 4 mg as needed.  Plan of Care (Initial workup plan)  Note: Mr. Ferns  was reminded that as per protocol, today's visit has been an evaluation only. We have not taken over the patient's controlled substance management.   Lab Orders     Compliance Drug Analysis, Ur   Procedure Orders     INTERCOSTAL NERVE BLOCK Pharmacotherapy (current): Medications ordered:  No orders of the defined types were placed in this encounter.  Medications administered during this visit: Luke Russo had no medications administered during this visit.    Pharmacological management options:  Opioid Analgesics: The patient was informed that there is no guarantee that he would be a candidate for opioid analgesics. The decision will be made following CDC guidelines. This decision will be based on the results of diagnostic studies, as well as Mr. Shepherd risk profile.   Membrane stabilizer: Adequate regimen  Muscle relaxant: Adequate regimen  NSAID: Adequate regimen  Other analgesic(s): To be determined at a later time   Interventional management options: Mr. Lehrke was informed that there is no guarantee that he would be a candidate for interventional therapies. The decision will be based on the results of diagnostic studies, as well as Mr. Everton risk profile.  Procedure(s) under consideration:  Diagnostic bilateral intercostal nerve block Intercostal pulsed radiofrequency ablation Diagnostic thoracic facet medial branch nerve block Thoracic facet radiofrequency ablation.   Provider-requested follow-up: Return for Procedure- B/L ICNB w/ sedation.  No future appointments.  Total duration of non-face-to-face encounter: 45 minutes.  Primary Care Physician: Juluis Pitch, MD Location: [facility] Note by: Gillis Santa, MD Date: 01/18/2019; Time: 10:51 AM  Note: This dictation was prepared with Dragon dictation. Any transcriptional errors that may result from this process are unintentional.

## 2019-01-17 ENCOUNTER — Encounter: Payer: Self-pay | Admitting: Student in an Organized Health Care Education/Training Program

## 2019-01-17 DIAGNOSIS — E083299 Diabetes mellitus due to underlying condition with mild nonproliferative diabetic retinopathy without macular edema, unspecified eye: Secondary | ICD-10-CM | POA: Insufficient documentation

## 2019-01-18 ENCOUNTER — Encounter: Payer: Self-pay | Admitting: Student in an Organized Health Care Education/Training Program

## 2019-01-18 ENCOUNTER — Other Ambulatory Visit: Payer: Self-pay

## 2019-01-18 ENCOUNTER — Ambulatory Visit
Payer: BC Managed Care – PPO | Attending: Student in an Organized Health Care Education/Training Program | Admitting: Student in an Organized Health Care Education/Training Program

## 2019-01-18 DIAGNOSIS — R0781 Pleurodynia: Secondary | ICD-10-CM | POA: Insufficient documentation

## 2019-01-18 DIAGNOSIS — M5412 Radiculopathy, cervical region: Secondary | ICD-10-CM | POA: Insufficient documentation

## 2019-01-18 DIAGNOSIS — G588 Other specified mononeuropathies: Secondary | ICD-10-CM | POA: Diagnosis not present

## 2019-01-18 DIAGNOSIS — M47812 Spondylosis without myelopathy or radiculopathy, cervical region: Secondary | ICD-10-CM | POA: Diagnosis not present

## 2019-01-18 DIAGNOSIS — G894 Chronic pain syndrome: Secondary | ICD-10-CM | POA: Insufficient documentation

## 2019-01-26 ENCOUNTER — Other Ambulatory Visit: Payer: Self-pay

## 2019-01-26 ENCOUNTER — Other Ambulatory Visit
Admission: RE | Admit: 2019-01-26 | Discharge: 2019-01-26 | Disposition: A | Discharge status: Home / Self Care | Payer: BC Managed Care – PPO | Attending: Student in an Organized Health Care Education/Training Program | Admitting: Student in an Organized Health Care Education/Training Program

## 2019-01-26 DIAGNOSIS — G894 Chronic pain syndrome: Secondary | ICD-10-CM | POA: Insufficient documentation

## 2019-01-26 LAB — URINE DRUG SCREEN, QUALITATIVE (ARMC ONLY)
Amphetamines, Ur Screen: NOT DETECTED
Barbiturates, Ur Screen: NOT DETECTED
Benzodiazepine, Ur Scrn: NOT DETECTED
Cannabinoid 50 Ng, Ur ~~LOC~~: NOT DETECTED
Cocaine Metabolite,Ur ~~LOC~~: NOT DETECTED
MDMA (Ecstasy)Ur Screen: NOT DETECTED
Methadone Scn, Ur: NOT DETECTED
Opiate, Ur Screen: NOT DETECTED
Phencyclidine (PCP) Ur S: NOT DETECTED
Tricyclic, Ur Screen: NOT DETECTED

## 2019-02-08 ENCOUNTER — Telehealth: Payer: Self-pay

## 2019-02-08 NOTE — Telephone Encounter (Signed)
The patient called and said he has an appointment with Dr. Shea Evans on 03/22/19 and that Dr. Shea Evans told him to call us and tell Dr. Holley Raring that he can prescribe the patient his medications to last until his appt there.

## 2019-02-13 ENCOUNTER — Telehealth: Payer: Self-pay | Admitting: *Deleted

## 2019-02-13 NOTE — Telephone Encounter (Signed)
Talked with patient and let him know that at this time we are not prescribing any pain medication.  Let him know that I have also spoken with Dr Holley Raring and that until pyschological evaluation is completed and we receive documentation and risk are reviewed, at that time, then consideration will be taken to take over pain management.  Also, explained that he has not entered into any agreement to prescribe medications. Patient also advised that potentially he could go through his PCP for pain medication.

## 2019-02-13 NOTE — Telephone Encounter (Signed)
Attempted to reach patient, no answer and mailbox is full.  

## 2019-02-14 ENCOUNTER — Other Ambulatory Visit: Admission: RE | Admit: 2019-02-14 | Payer: BC Managed Care – PPO | Source: Ambulatory Visit

## 2019-02-15 ENCOUNTER — Other Ambulatory Visit: Payer: Self-pay

## 2019-02-15 ENCOUNTER — Other Ambulatory Visit
Admission: RE | Admit: 2019-02-15 | Discharge: 2019-02-15 | Disposition: A | Discharge status: Home / Self Care | Payer: BC Managed Care – PPO | Source: Ambulatory Visit | Attending: Student in an Organized Health Care Education/Training Program | Admitting: Student in an Organized Health Care Education/Training Program

## 2019-02-15 DIAGNOSIS — Z01812 Encounter for preprocedural laboratory examination: Secondary | ICD-10-CM | POA: Insufficient documentation

## 2019-02-15 DIAGNOSIS — Z1159 Encounter for screening for other viral diseases: Secondary | ICD-10-CM | POA: Diagnosis not present

## 2019-02-16 LAB — SARS CORONAVIRUS 2 (TAT 6-24 HRS): SARS Coronavirus 2: NEGATIVE

## 2019-02-19 ENCOUNTER — Ambulatory Visit
Admission: RE | Admit: 2019-02-19 | Discharge: 2019-02-19 | Disposition: A | Discharge status: Home / Self Care | Payer: BC Managed Care – PPO | Source: Ambulatory Visit | Attending: Student in an Organized Health Care Education/Training Program | Admitting: Student in an Organized Health Care Education/Training Program

## 2019-02-19 ENCOUNTER — Encounter: Payer: Self-pay | Admitting: Student in an Organized Health Care Education/Training Program

## 2019-02-19 ENCOUNTER — Ambulatory Visit (HOSPITAL_BASED_OUTPATIENT_CLINIC_OR_DEPARTMENT_OTHER): Payer: BC Managed Care – PPO | Admitting: Student in an Organized Health Care Education/Training Program

## 2019-02-19 ENCOUNTER — Other Ambulatory Visit: Payer: Self-pay

## 2019-02-19 DIAGNOSIS — G588 Other specified mononeuropathies: Secondary | ICD-10-CM | POA: Diagnosis present

## 2019-02-19 MED ORDER — LIDOCAINE HCL 2 % IJ SOLN
INTRAMUSCULAR | Status: AC
  Filled 2019-02-19: qty 20

## 2019-02-19 MED ORDER — IOHEXOL 180 MG/ML  SOLN
10.0000 mL | Freq: Once | INTRAMUSCULAR | Status: AC
  Administered 2019-02-19: 10 mL via EPIDURAL

## 2019-02-19 MED ORDER — TRAMADOL HCL 50 MG PO TABS: 50.0000 mg | ORAL_TABLET | Freq: Two times a day (BID) | ORAL | 0 refills | Status: DC | PRN

## 2019-02-19 MED ORDER — DEXAMETHASONE SODIUM PHOSPHATE 10 MG/ML IJ SOLN
INTRAMUSCULAR | Status: AC
  Filled 2019-02-19: qty 1

## 2019-02-19 MED ORDER — FENTANYL CITRATE (PF) 100 MCG/2ML IJ SOLN
25.0000 ug | INTRAMUSCULAR | Status: DC | PRN
  Administered 2019-02-19: 50 ug via INTRAVENOUS

## 2019-02-19 MED ORDER — FENTANYL CITRATE (PF) 100 MCG/2ML IJ SOLN
INTRAMUSCULAR | Status: AC
  Filled 2019-02-19: qty 2

## 2019-02-19 MED ORDER — ROPIVACAINE HCL 2 MG/ML IJ SOLN
1.0000 mL | Freq: Once | INTRAMUSCULAR | Status: AC
  Administered 2019-02-19: 1 mL via EPIDURAL

## 2019-02-19 MED ORDER — LIDOCAINE HCL 2 % IJ SOLN
20.0000 mL | Freq: Once | INTRAMUSCULAR | Status: AC
  Administered 2019-02-19: 400 mg

## 2019-02-19 MED ORDER — ROPIVACAINE HCL 2 MG/ML IJ SOLN
INTRAMUSCULAR | Status: AC
  Filled 2019-02-19: qty 10

## 2019-02-19 MED ORDER — DEXAMETHASONE SODIUM PHOSPHATE 10 MG/ML IJ SOLN
10.0000 mg | Freq: Once | INTRAMUSCULAR | Status: AC
  Administered 2019-02-19: 10 mg

## 2019-02-19 NOTE — Progress Notes (Signed)
Patient's Name: Luke Russo  MRN: [id number]  Referring Provider: Gillis Santa, MD  DOB: 10-05-1965  PCP: Juluis Pitch, MD  DOS: 02/19/2019  Note by: Gillis Santa, MD  Service setting: Ambulatory outpatient  Specialty: Interventional Pain Management  Patient type: Established  Location: [facility] (AMB) Pain Management Facility  Visit type: Interventional Procedure   Primary Reason for Visit: Interventional Pain Management Treatment. CC: Other (rib cage)  Procedure:          Anesthesia, Analgesia, Anxiolysis:  Type: Diagnostic Posterolateral Intercostal  Nerve Block  #1  Region: Posterolateral  Thoracic Area Level: T10,T11, T12 Ribs Laterality: Bilateral  Type: Moderate (Conscious) Sedation combined with Local Anesthesia Indication(s): Analgesia and Anxiety Route: Intravenous (IV) IV Access: Secured Sedation: Meaningful verbal contact was maintained at all times during the procedure  Local Anesthetic: Lidocaine 1-2%  Position: Prone   Indications: 1. Intercostal neuralgia    Pain Score: Pre-procedure: 5 /10 Post-procedure: 5 /10  Pre-op Assessment:  Luke Russo is a 53 y.o. (year old), male patient, seen today for interventional treatment. He  has a past surgical history that includes Appendectomy. Luke Russo has a current medication list which includes the following prescription(s): alprazolam, dulaglutide, fluticasone, gemfibrozil, glipizide, insulin aspart protamine - aspart, lovastatin, meloxicam, metformin, naloxone, omeprazole, ondansetron, vitamin b-12, farxiga, lyrica, pioglitazone, tizanidine, and tramadol, and the following Facility-Administered Medications: fentanyl. His primarily concern today is the Other (rib cage)  Initial Vital Signs:  Pulse/HCG Rate: 83ECG Heart Rate: 81 Temp: 98.2 F (36.8 C) Resp: (!) 35 BP: (!) 156/89 SpO2: 100 %  BMI: Estimated body mass index is 24.02 kg/m as calculated from the following:   Height as of this encounter: 5\' 8"  (1.727  m).   Weight as of this encounter: 158 lb (71.7 kg).  Risk Assessment: Allergies: Reviewed. He has No Known Allergies.  Allergy Precautions: None required Coagulopathies: Reviewed. None identified.  Blood-thinner therapy: None at this time Active Infection(s): Reviewed. None identified. Luke Russo is afebrile  Site Confirmation: Luke Russo was asked to confirm the procedure and laterality before marking the site Procedure checklist: Completed Consent: Before the procedure and under the influence of no sedative(s), amnesic(s), or anxiolytics, the patient was informed of the treatment options, risks and possible complications. To fulfill our ethical and legal obligations, as recommended by the American Medical Association's Code of Ethics, I have informed the patient of my clinical impression; the nature and purpose of the treatment or procedure; the risks, benefits, and possible complications of the intervention; the alternatives, including doing nothing; the risk(s) and benefit(s) of the alternative treatment(s) or procedure(s); and the risk(s) and benefit(s) of doing nothing. The patient was provided information about the general risks and possible complications associated with the procedure. These may include, but are not limited to: failure to achieve desired goals, infection, bleeding, organ or nerve damage, allergic reactions, paralysis, and death. In addition, the patient was informed of those risks and complications associated to the procedure, such as failure to decrease pain; infection; bleeding; organ or nerve damage with subsequent damage to sensory, motor, and/or autonomic systems, resulting in permanent pain, numbness, and/or weakness of one or several areas of the body; allergic reactions; (i.e.: anaphylactic reaction); and/or death. Furthermore, the patient was informed of those risks and complications associated with the medications. These include, but are not limited to: allergic  reactions (i.e.: anaphylactic or anaphylactoid reaction(s)); adrenal axis suppression; blood sugar elevation that in diabetics may result in ketoacidosis or comma; water retention that in patients  with history of congestive heart failure may result in shortness of breath, pulmonary edema, and decompensation with resultant heart failure; weight gain; swelling or edema; medication-induced neural toxicity; particulate matter embolism and blood vessel occlusion with resultant organ, and/or nervous system infarction; and/or aseptic necrosis of one or more joints. Finally, the patient was informed that Medicine is not an exact science; therefore, there is also the possibility of unforeseen or unpredictable risks and/or possible complications that may result in a catastrophic outcome. The patient indicated having understood very clearly. We have given the patient no guarantees and we have made no promises. Enough time was given to the patient to ask questions, all of which were answered to the patient's satisfaction. Luke Russo has indicated that he wanted to continue with the procedure. Attestation: I, the ordering provider, attest that I have discussed with the patient the benefits, risks, side-effects, alternatives, likelihood of achieving goals, and potential problems during recovery for the procedure that I have provided informed consent. Date   Time: 02/19/2019 11:05 AM  Pre-Procedure Preparation:  Monitoring: As per clinic protocol. Respiration, ETCO2, SpO2, BP, heart rate and rhythm monitor placed and checked for adequate function Safety Precautions: Patient was assessed for positional comfort and pressure points before starting the procedure. Time-out: I initiated and conducted the "Time-out" before starting the procedure, as per protocol. The patient was asked to participate by confirming the accuracy of the "Time Out" information. Verification of the correct person, site, and procedure were performed  and confirmed by me, the nursing staff, and the patient. "Time-out" conducted as per Joint Commission's Universal Protocol (UP.01.01.01). Time: 1139  Description of Procedure:          Target Area: The sub-costal neurovascular bundle area Approach: Sub-costal approach Area Prepped: Entire Posterolateral  Thoracic Region Prepping solution: DuraPrep (Iodine Povacrylex [0.7% available iodine] and Isopropyl Alcohol, 74% w/w) Safety Precautions: Aspiration looking for blood return was conducted prior to all injections. At no point did we inject any substances, as a needle was being advanced. No attempts were made at seeking any paresthesias. Safe injection practices and needle disposal techniques used. Medications properly checked for expiration dates. SDV (single dose vial) medications used. Description of the Procedure: Protocol guidelines were followed. The patient was placed in position over the procedure table. The target area was identified and the area prepped in the usual manner. Skin & deeper tissues infiltrated with local anesthetic. After cleaning the skin with an antiseptic solution, 1-2 mL of dilute local anesthetic was infiltrated subcutaneously at the planned injection site. The fingers of the palpating hand were used to straddle the insertion site at the inferior border of the rib and fix the skin to avoid unwanted skin movement. Appropriate amount of time allowed to pass for local anesthetics to take effect. The procedure needles were then advanced to the target area at an angle of approximately 20 cephalad to the skin. Contact with the rib was made. While maintaining the same angle of insertion, the needle was walked off the inferior border of the rib as the skin was allowed to return to its initial position. Then the needle was advanced no more than 3 mm below the inferior margin of the rib. Proper needle placement was secured. Negative aspiration confirmed. Following negative aspiration for  blood or air, 3-5 mL of local anesthetic was injected. The solution injected in intermittent fashion, asking for systemic symptoms every 0.5cc of injectate. The needle was then removed and the area cleansed, making sure to leave  some of the prepping solution back to take advantage of its long term bactericidal properties. Vitals:   02/19/19 1205 02/19/19 1210 02/19/19 1216 02/19/19 1226  BP: (!) 152/80 (!) 158/88 (!) 161/92   Pulse: 80 78    Resp: (!) 39 (!) 37 (!) 40 (!) 33  Temp:   98 F (36.7 C)   SpO2: 100% 100% 100% 99%  Weight:      Height:        Start Time: 1139 hrs. End Time: 1207 hrs.   Materials:  Needle(s) Type: Regular needle Gauge: 25G Length: 3.5-in Medication(s): Please see orders for medications and dosing details. 6 cc solution made of 4 cc of 0.2% ropivacaine, 2 cc of Decadron 10 mg/cc.  1 cc injected at each level above bilaterally.  Total steroid dose equals 20 mg of Decadron. Imaging Guidance (Non-Spinal):          Type of Imaging Technique: Fluoroscopy Guidance (Non-Spinal) Indication(s): Assistance in needle guidance and placement for procedures requiring needle placement in or near specific anatomical locations not easily accessible without such assistance. Exposure Time: Please see nurses notes. Contrast: Before injecting any contrast, we confirmed that the patient did not have an allergy to iodine, shellfish, or radiological contrast. Once satisfactory needle placement was completed at the desired level, radiological contrast was injected. Contrast injected under live fluoroscopy. No contrast complications. See chart for type and volume of contrast used. Fluoroscopic Guidance: I was personally present during the use of fluoroscopy. "Tunnel Vision Technique" used to obtain the best possible view of the target area. Parallax error corrected before commencing the procedure. "Direction-depth-direction" technique used to introduce the needle under continuous pulsed  fluoroscopy. Once target was reached, antero-posterior, oblique, and lateral fluoroscopic projection used confirm needle placement in all planes. Images permanently stored in EMR. Interpretation: I personally interpreted the imaging intraoperatively. Adequate needle placement confirmed in multiple planes. Appropriate spread of contrast into desired area was observed. No evidence of afferent or efferent intravascular uptake. Permanent images saved into the patient's record.  Antibiotic Prophylaxis:   Anti-infectives (From admission, onward)   None     Indication(s): None identified  Post-operative Assessment:  Post-procedure Vital Signs:  Pulse/HCG Rate: 7876 Temp: 98 F (36.7 C) Resp: (!) 33 BP: (!) 161/92 SpO2: 99 %  EBL: None  Complications: No immediate post-treatment complications observed by team, or reported by patient.  Note: The patient tolerated the entire procedure well. A repeat set of vitals were taken after the procedure and the patient was kept under observation following institutional policy, for this type of procedure. Post-procedural neurological assessment was performed, showing return to baseline, prior to discharge. The patient was provided with post-procedure discharge instructions, including a section on how to identify potential problems. Should any problems arise concerning this procedure, the patient was given instructions to immediately contact us, at any time, without hesitation. In any case, we plan to contact the patient by telephone for a follow-up status report regarding this interventional procedure.  Comments:  No additional relevant information.  Plan of Care  Orders:  Orders Placed This Encounter  Procedures   DG PAIN CLINIC C-ARM 1-60 MIN NO REPORT    Intraoperative interpretation by procedural physician at [place].    Standing Status:   Standing    Number of Occurrences:   1    Order Specific Question:   Reason for exam:     Answer:   Assistance in needle guidance and placement for procedures requiring needle  placement in or near specific anatomical locations not easily accessible without such assistance.    Medications ordered for procedure: Meds ordered this encounter  Medications   iohexol (OMNIPAQUE) 180 MG/ML injection 10 mL    Must be Myelogram-compatible. If not available, you may substitute with a water-soluble, non-ionic, hypoallergenic, myelogram-compatible radiological contrast medium.   lidocaine (XYLOCAINE) 2 % (with pres) injection 400 mg   fentaNYL (SUBLIMAZE) injection 25-50 mcg    Make sure Narcan is available in the pyxis when using this medication. In the event of respiratory depression (RR< 8/min): Titrate NARCAN (naloxone) in increments of 0.1 to 0.2 mg IV at 2-3 minute intervals, until desired degree of reversal.   dexamethasone (DECADRON) injection 10 mg   ropivacaine (PF) 2 mg/mL (0.2%) (NAROPIN) injection 1 mL   traMADol (ULTRAM) 50 MG tablet    Sig: Take 1 tablet (50 mg total) by mouth every 12 (twelve) hours as needed.    Dispense:  21 tablet    Refill:  0   Medications administered: We administered iohexol, lidocaine, fentaNYL, dexamethasone, and ropivacaine (PF) 2 mg/mL (0.2%).  See the medical record for exact dosing, route, and time of administration.  Follow-up plan:   Return in about 5 weeks (around 03/26/2019) for Post Procedure Evaluation, Medication Management, After Psychological evaluation, virtual.     Recent Visits Date Type Provider Dept  01/18/19 Office Visit Gillis Santa, MD [facility]  Showing recent visits within past 90 days and meeting all other requirements   Today's Visits Date Type Provider Dept  02/19/19 Procedure visit Gillis Santa, MD [facility]  Showing today's visits and meeting all other requirements   Future Appointments Date Type Provider Dept  03/26/19 Appointment Gillis Santa, MD [facility]  Showing  future appointments within next 90 days and meeting all other requirements   Disposition: Discharge home  Discharge Date & Time: 02/19/2019;   hrs.   Primary Care Physician: Juluis Pitch, MD Location: [facility] Note by: Gillis Santa, MD Date: 02/19/2019; Time: 12:36 PM  Disclaimer:  Medicine is not an exact science. The only guarantee in medicine is that nothing is guaranteed. It is important to note that the decision to proceed with this intervention was based on the information collected from the patient. The Data and conclusions were drawn from the patient's questionnaire, the interview, and the physical examination. Because the information was provided in large part by the patient, it cannot be guaranteed that it has not been purposely or unconsciously manipulated. Every effort has been made to obtain as much relevant data as possible for this evaluation. It is important to note that the conclusions that lead to this procedure are derived in large part from the available data. Always take into account that the treatment will also be dependent on availability of resources and existing treatment guidelines, considered by other Pain Management Practitioners as being common knowledge and practice, at the time of the intervention. For Medico-Legal purposes, it is also important to point out that variation in procedural techniques and pharmacological choices are the acceptable norm. The indications, contraindications, technique, and results of the above procedure should only be interpreted and judged by a Board-Certified Interventional Pain Specialist with extensive familiarity and expertise in the same exact procedure and technique.

## 2019-02-19 NOTE — Patient Instructions (Signed)

## 2019-02-20 ENCOUNTER — Telehealth: Payer: Self-pay

## 2019-02-20 NOTE — Telephone Encounter (Signed)
Post procedure phone call.  Patient states he hasnt really gotten up yet.  Informed him to call us for any questions or concerns.

## 2019-02-23 ENCOUNTER — Other Ambulatory Visit: Payer: Self-pay | Admitting: Neurology

## 2019-02-23 DIAGNOSIS — R200 Anesthesia of skin: Secondary | ICD-10-CM

## 2019-03-08 ENCOUNTER — Other Ambulatory Visit: Payer: Self-pay

## 2019-03-08 ENCOUNTER — Ambulatory Visit
Admission: RE | Admit: 2019-03-08 | Discharge: 2019-03-08 | Disposition: A | Discharge status: Home / Self Care | Payer: BC Managed Care – PPO | Source: Ambulatory Visit | Attending: Neurology | Admitting: Neurology

## 2019-03-08 DIAGNOSIS — R202 Paresthesia of skin: Secondary | ICD-10-CM | POA: Insufficient documentation

## 2019-03-08 DIAGNOSIS — R200 Anesthesia of skin: Secondary | ICD-10-CM | POA: Diagnosis present

## 2019-03-08 MED ORDER — GADOBUTROL 1 MMOL/ML IV SOLN
7.0000 mL | Freq: Once | INTRAVENOUS | Status: AC | PRN
  Administered 2019-03-08: 7 mL via INTRAVENOUS

## 2019-03-22 ENCOUNTER — Ambulatory Visit (INDEPENDENT_AMBULATORY_CARE_PROVIDER_SITE_OTHER): Payer: BC Managed Care – PPO | Admitting: Psychiatry

## 2019-03-22 ENCOUNTER — Encounter: Payer: Self-pay | Admitting: Student in an Organized Health Care Education/Training Program

## 2019-03-22 ENCOUNTER — Encounter: Payer: Self-pay | Admitting: Psychiatry

## 2019-03-22 ENCOUNTER — Other Ambulatory Visit: Payer: Self-pay

## 2019-03-22 DIAGNOSIS — Z008 Encounter for other general examination: Secondary | ICD-10-CM | POA: Diagnosis not present

## 2019-03-22 DIAGNOSIS — G894 Chronic pain syndrome: Secondary | ICD-10-CM | POA: Diagnosis not present

## 2019-03-22 NOTE — Progress Notes (Signed)
Virtual Visit via Video Note  I connected with Luke Russo on 03/22/19 at  1:00 PM EDT by a video enabled telemedicine application and verified that I am speaking with the correct person using two identifiers.   I discussed the limitations of evaluation and management by telemedicine and the availability of in person appointments. The patient expressed understanding and agreed to proceed.   I discussed the assessment and treatment plan with the patient. The patient was provided an opportunity to ask questions and all were answered. The patient agreed with the plan and demonstrated an understanding of the instructions.   The patient was advised to call back or seek an in-person evaluation if the symptoms worsen or if the condition fails to improve as anticipated.    Psychiatric Initial Adult Assessment   Patient Identification: Luke Russo MRN:  [id number] Date of Evaluation:  03/22/2019 Referral Source: Dr.Bilal Lateef  Chief Complaint:   Chief Complaint    Psychiatric Evaluation     Visit Diagnosis:    ICD-10-CM   1. Evaluation by psychiatric service required  Z00.8   2. Chronic pain syndrome  G89.4     History of Present Illness:  Luke Russo is a 53 year old Caucasian male, married, currently unemployed, lives in [place], has a history of chronic pain, diabetes melitis, hyperlipidemia, was evaluated by telemedicine today.  Patient was referred to the clinic for evaluation of possible mental health/substance abuse risk potential prior to initiation of pain management by his pain provider.  Patient reports he has been struggling with pain since the past several years.  Patient reports he struggles with rib pain-bilateral as well as back pain.  He reports he used to be on tramadol previously.  He reports his pain used to be so severe that there were times he could not even touch his skin with his own clothes.  It affected his daily level of functioning.  Patient reported that 2 weeks  ago his pain provider Dr. Holley Raring completed a procedure on him which has helped this pain.  He currently rates his pain at a 3, 10 being the worst.  He also has not been taking the tramadol since the tramadol was also giving a lot of side effects especially constipation.  Patient denies any depressive symptoms.  He does report he feels sad about his chronic pain.  He however denies any significant symptoms of depression like mood lability, anhedonia, insomnia, suicidality or problems with concentration.  Patient denies any significant anxiety symptoms.  He does report he is anxious about the current pandemic just like anybody else.  Patient denies any history of trauma.  Patient reports he is currently jobless since he got fired from his work.  He reports he does worry about financial issues and wants to either find a job or apply for disability.  His wife currently works and that does help.  He has good support system from his wife.  Patient denies any substance abuse problems.  Patient does report history of diabetes melitis which is currently being managed by his primary provider.  Associated Signs/Symptoms: Depression Symptoms:  Denies (Hypo) Manic Symptoms:  Denies Anxiety Symptoms:  anxiety about the pandemic  Psychotic Symptoms:  Denies PTSD Symptoms: Negative  Past Psychiatric History: Patient denies a history of depression, anxiety.  Patient denies inpatient mental health admissions .  Patient denies suicide attempts.  Previous Psychotropic Medications: No   Substance Abuse History in the last 12 months:  No.  Consequences of Substance Abuse: Negative  Past Medical History:  Past Medical History:  Diagnosis Date  . Diabetes mellitus without complication ([place])   . History of hiatal hernia   . Hyperlipidemia     Past Surgical History:  Procedure Laterality Date  . APPENDECTOMY      Family Psychiatric History: Patient denies history of mental health problems in his  family  Family History:  Family History  Problem Relation Age of Onset  . Diabetes Mother   . Breast cancer Mother   . Thyroid disease Mother   . Heart disease Mother   . Heart disease Father   . Hypertension Father   . Lupus Sister   . Diabetes Maternal Grandmother   . Heart disease Maternal Grandmother   . Diabetes Maternal Grandfather   . Heart disease Maternal Grandfather     Social History:   Social History   Socioeconomic History  . Marital status: Married    Spouse name: Luke Russo   . Number of children: 3  . Years of education: Not on file  . Highest education level: Associate degree: occupational, Hotel manager, or vocational program  Occupational History  . Not on file  Social Needs  . Financial resource strain: Somewhat hard  . Food insecurity    Worry: Often true    Inability: Often true  . Transportation needs    Medical: No    Non-medical: No  Tobacco Use  . Smoking status: Never Smoker  . Smokeless tobacco: Never Used  Substance and Sexual Activity  . Alcohol use: No    Frequency: Never  . Drug use: No  . Sexual activity: Not Currently  Lifestyle  . Physical activity    Days per week: 3 days    Minutes per session: 30 min  . Stress: Not on file  Relationships  . Social Herbalist on phone: Not on file    Gets together: Not on file    Attends religious service: Never    Active member of club or organization: Yes    Attends meetings of clubs or organizations: Never    Relationship status: Married  Other Topics Concern  . Not on file  Social History Narrative  . Not on file    Additional Social History: Patient has been married twice.  Patient lives in [place].  Patient has 3 adult children.  He reports he had an okay childhood.  Patient used to work in the past however lost his job.  His wife works and is supportive.  Patient denies any history of legal problems.  Allergies:  No Known Allergies  Metabolic Disorder Labs: No results  found for: HGBA1C, MPG No results found for: PROLACTIN Lab Results  Component Value Date   CHOL 204 (H) 11/08/2013   TRIG 182 11/08/2013   HDL 36 (L) 11/08/2013   VLDL 36 11/08/2013   LDLCALC 132 (H) 11/08/2013   LDLCALC 127 (H) 11/07/2013   No results found for: TSH  Therapeutic Level Labs: No results found for: LITHIUM No results found for: CBMZ No results found for: VALPROATE  Current Medications: Current Outpatient Medications  Medication Sig Dispense Refill  . ALPRAZolam (XANAX) 0.5 MG tablet Take 0.5 mg by mouth as needed.  0  . Dulaglutide 1.5 MG/0.5ML SOPN Inject into the skin.    . Ertugliflozin-metFORMIN HCl (SEGLUROMET) 7.5-500 MG TABS Take by mouth daily.    Marland Kitchen FARXIGA 10 MG TABS tablet Take 10 mg by mouth daily.  1  . gemfibrozil (LOPID) 600 MG tablet  Take 600 mg by mouth 2 (two) times daily.  0  . insulin aspart protamine - aspart (NOVOLOG MIX 70/30 FLEXPEN) (70-30) 100 UNIT/ML FlexPen Inject 15 units before breakfast meal. Inject 20 units before supper meal.    . Insulin Glargine-Lixisenatide (SOLIQUA) 100-33 UNT-MCG/ML SOPN Inject 30 Units into the skin daily.    Marland Kitchen lovastatin (MEVACOR) 40 MG tablet Take 40 mg by mouth daily.  0  . meloxicam (MOBIC) 15 MG tablet 15 mg daily.     Marland Kitchen omeprazole (PRILOSEC) 20 MG capsule Take 20 mg by mouth 2 (two) times daily.  0  . vitamin B-12 (CYANOCOBALAMIN) 1000 MCG tablet Take by mouth.    . fluticasone (FLONASE) 50 MCG/ACT nasal spray instill 2 sprays into each nostril once daily    . naloxone (NARCAN) nasal spray 4 mg/0.1 mL Place into the nose.    . ondansetron (ZOFRAN) 4 MG tablet Take by mouth.     No current facility-administered medications for this visit.     Musculoskeletal: Strength & Muscle Tone: UTA Gait & Station: wnl Patient leans: N/A  Psychiatric Specialty Exam: Review of Systems  Musculoskeletal: Positive for back pain.       Rib pain   Psychiatric/Behavioral: Negative for depression and suicidal ideas.  The patient is not nervous/anxious.   All other systems reviewed and are negative.   There were no vitals taken for this visit.There is no height or weight on file to calculate BMI.  General Appearance: Casual  Eye Contact:  Fair  Speech:  Normal Rate  Volume:  Normal  Mood:  Euthymic  Affect:  Congruent  Thought Process:  Goal Directed and Descriptions of Associations: Intact  Orientation:  Full (Time, Place, and Person)  Thought Content:  Logical  Suicidal Thoughts:  No  Homicidal Thoughts:  No  Memory:  Immediate;   Fair Recent;   Fair Remote;   Fair  Judgement:  Fair  Insight:  Fair  Psychomotor Activity:  Normal  Concentration:  Concentration: Fair and Attention Span: Fair  Recall:  [employer] of Knowledge:Fair  Language: Fair  Akathisia:  No  Handed:  Right  AIMS (if indicated):  UTA  Assets:  Communication Skills Desire for Improvement Social Support  ADL's:  Intact  Cognition: WNL  Sleep:  Fair   Screenings:   Assessment and Plan: Elion is a 53 year old Caucasian male, married, unemployed, lives in [place], has a history of chronic pain syndrome, diabetes melitis, hyperlipidemia was evaluated by telemedicine today.  Patient was referred to the clinic for routine assessment of possible mental health/substance abuse risk potential prior to initiation of pain medications.  Patient currently denies any mood symptoms, denies suicidality or perceptual disturbances.  Patient denies substance abuse problems.  Patient does have psychosocial stressors of his chronic pain, current pandemic as well as job loss.  Patient however is coping okay and is hoping to get help with his pain which will allow him to be able to function better in his day-to-day activities.  The following instruments were used Clinical interview Screener and opioid assessment for patients with pain/revised Opioid risk tool Drug abuse screening test Alcohol use disorder identification test PHQ-9 GAD  7   Based on clinical interview and instrument used at the time of evaluation the risk is determined to be low.  I have spent atleast 60 minutes non face to face with patient today. More than 50 % of the time was spent for psychoeducation and supportive psychotherapy and care coordination.  This note was generated in part or whole with voice recognition software. Voice recognition is usually quite accurate but there are transcription errors that can and very often do occur. I apologize for any typographical errors that were not detected and corrected.        Ursula Alert, MD 8/13/20201:49 PM

## 2019-03-26 ENCOUNTER — Ambulatory Visit
Payer: BC Managed Care – PPO | Attending: Student in an Organized Health Care Education/Training Program | Admitting: Student in an Organized Health Care Education/Training Program

## 2019-03-26 ENCOUNTER — Other Ambulatory Visit: Payer: Self-pay

## 2019-03-26 DIAGNOSIS — G894 Chronic pain syndrome: Secondary | ICD-10-CM | POA: Diagnosis not present

## 2019-03-26 DIAGNOSIS — G588 Other specified mononeuropathies: Secondary | ICD-10-CM | POA: Diagnosis not present

## 2019-03-26 DIAGNOSIS — R0781 Pleurodynia: Secondary | ICD-10-CM | POA: Diagnosis not present

## 2019-03-26 MED ORDER — TIZANIDINE HCL 4 MG PO TABS: 4.0000 mg | ORAL_TABLET | Freq: Three times a day (TID) | ORAL | 0 refills | Status: AC | PRN

## 2019-03-26 MED ORDER — TRAMADOL HCL 50 MG PO TABS: 50.0000 mg | ORAL_TABLET | Freq: Two times a day (BID) | ORAL | 0 refills | Status: AC | PRN

## 2019-03-26 NOTE — Progress Notes (Signed)
Pain Management Virtual Encounter Note - Virtual Visit via [place] (real-time audio visits between healthcare provider and patient).   Patient's Phone No. & Preferred Pharmacy:  [phone] (home); [phone] (mobile); (Preferred) [phone] [email or web address]  [facility] [place], [place] - [address] [place] [address] Phone: [phone] Fax: [phone]  [facility] [place] [place] [address] Phone: [phone] Fax: [phone]    Pre-screening note:  Our staff contacted Luke Russo and offered him an "in person", "face-to-face" appointment versus a telephone encounter. He indicated preferring the telephone encounter, at this time.   Reason for Virtual Visit: COVID-19*  Social distancing based on CDC and AMA recommendations.   I contacted Luke Russo on 03/26/2019 via video conference.      I clearly identified myself as Gillis Santa, MD. I verified that I was speaking with the correct person using two identifiers (Name: Luke Russo, and date of birth: 03-11-1966).  Advanced Informed Consent I sought verbal advanced consent from Luke Russo for virtual visit interactions. I informed Luke Russo of possible security and privacy concerns, risks, and limitations associated with providing "not-in-person" medical evaluation and management services. I also informed Luke Russo of the availability of "in-person" appointments. Finally, I informed him that there would be a charge for the virtual visit and that he could be  personally, fully or partially, financially responsible for it. Luke Russo expressed understanding and agreed to proceed.   Historic Elements   Luke Russo is a 53 y.o. year old, male patient evaluated today after his last encounter by our practice on 02/20/2019. Luke Russo   has a past medical history of Diabetes mellitus without complication ([place]), History of hiatal hernia, and Hyperlipidemia. He also  has a past surgical history that includes Appendectomy. Luke Russo has a current medication list which includes the following prescription(s): alprazolam, dulaglutide, segluromet, fluticasone, insulin aspart protamine - aspart, soliqua, lovastatin, meloxicam, naloxone, omeprazole, ondansetron, vitamin b-12, farxiga, gemfibrozil, tizanidine, and tramadol. He  reports that he has never smoked. He has never used smokeless tobacco. He reports that he does not drink alcohol or use drugs. Luke Russo has No Known Allergies.   HPI  Today, he is being contacted for a post-procedure assessment.  Evaluation of last interventional procedure  02/19/2019 Procedure: Bilateral T10, T11, T12 Intercostal Nerve block Pre-procedure pain score:  5/10 Post-procedure pain score: 5/10         Influential Factors: Intra-procedural challenges: None observed.         Reported side-effects: None.        Post-procedural adverse reactions or complications: None reported         Sedation: Please see nurses note for DOS. When no sedatives are used, the analgesic levels obtained are directly associated to the effectiveness of the local anesthetics. However, when sedation is provided, the level of analgesia obtained during the initial 1 hour following the intervention, is believed to be the result of a combination of factors. These factors may include, but are not limited to: 1. The effectiveness of the local anesthetics used. 2. The effects of the analgesic(s) and/or anxiolytic(s) used. 3. The degree of discomfort experienced by the patient at the time of the procedure. 4. The patients ability and reliability in recalling and recording the events. 5. The presence and influence of  possible secondary gains and/or psychosocial factors. Reported result: Relief experienced during the 1st hour after  the procedure: 0 % (Ultra-Short Term Relief)            Interpretative annotation: Unexpected result. Non-physiologic response.          Effects of local anesthetic: The analgesic effects attained during this period are directly associated to the localized infiltration of local anesthetics and therefore cary significant diagnostic value as to the etiological location, or anatomical origin, of the pain. Expected duration of relief is directly dependent on the pharmacodynamics of the local anesthetic used. Long-acting (4-6 hours) anesthetics used.  Reported result: Relief during the next 4 to 6 hour after the procedure: 25 % (Short-Term Relief)            Interpretative annotation: Clinically appropriate result. Analgesia during this period is likely to be Local Anesthetic-related.          Long-term benefit: Defined as the period of time past the expected duration of local anesthetics (1 hour for short-acting and 4-6 hours for long-acting). With the possible exception of prolonged sympathetic blockade from the local anesthetics, benefits during this period are typically attributed to, or associated with, other factors such as analgesic sensory neuropraxia, antiinflammatory effects, or beneficial biochemical changes provided by agents other than the local anesthetics.  Reported result: Extended relief following procedure: 60 % (Long-Term Relief)            Interpretative annotation: Clinically appropriate result. Good relief. No permanent benefit expected. Inflammation plays a part in the etiology to the pain.           Laboratory Chemistry Profile (12 mo)  Renal: No results found for requested labs within last 8760 hours.  Lab Results  Component Value Date   GFRAA >60 07/21/2017   GFRNONAA >60 07/21/2017   Hepatic: No results found for requested labs within last 8760 hours. Lab Results  Component Value Date   AST 19 07/21/2017   ALT 16 (L) 07/21/2017   Other: No results found for requested  labs within last 8760 hours. Note: Above Lab results reviewed.  Imaging  Last 90 days:  Luke Russo MP Contrast  Result Date: 03/09/2019 CLINICAL DATA:  Numbness and tingling of left upper and lower extremity R20.0, R20.2 (ICD-10-CM) EXAM: MRI HEAD WITHOUT AND WITH CONTRAST TECHNIQUE: Multiplanar, multiecho pulse sequences of the brain and surrounding structures were obtained without and with intravenous contrast. CONTRAST:  7 mL Gadavist. COMPARISON:  None. FINDINGS: Brain: No evidence for acute infarction, hemorrhage, mass lesion, hydrocephalus, or extra-axial fluid. Normal for age cerebral volume. No significant white matter disease. Post infusion, no abnormal enhancement of the brain or meninges. Vascular: Normal flow voids. Skull and upper cervical spine: Normal marrow signal. Sinuses/Orbits: Negative. Other: None. IMPRESSION: Unremarkable MRI of the brain. No acute or focal intracranial abnormalities. No cause is seen for the reported symptoms. Electronically Signed   By: Staci Righter M.D.   On: 03/09/2019 08:25   [facility] C-arm 1-60 Min No Report  Result Date: 02/19/2019 Fluoro was used, but no Radiologist interpretation will be provided. Please refer to "NOTES" tab for provider progress note.   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  Patient had delayed pain relief but states that he is experiencing approximately 60 to 65% pain relief in regards to his chest wall pain after intercostal nerve block.  This suggest a positive diagnostic block discussed repeating nerve  block in the future when his pain returns.  Future considerations could also include pulsed radiofrequency ablation of the intercostal nerves. - INTERCOSTAL NERVE BLOCK; Standing  2. Rib pain (bilteral, no trauma, inciting event) -Positive diagnostic intercostal nerve block bilaterally at T10, T11, T12 x1, can repeat in the future and consider pulsed RFA -  tiZANidine (ZANAFLEX) 4 MG tablet; Take 1 tablet (4 mg total) by mouth every 8 (eight) hours as needed for muscle spasms.  Dispense: 90 tablet; Refill: 0  3. Chronic pain syndrome -Positive diagnostic intercostal nerve block bilaterally at T10, T11, T12 x1, can repeat in the future and consider pulsed RFA -Patient is utilizing less tramadol as a result of the intercostal nerve block.  We will provide a small quantity so that the patient can take if and when he has a pain flare.  Also recommend tizanidine as below. - traMADol (ULTRAM) 50 MG tablet; Take 1 tablet (50 mg total) by mouth every 12 (twelve) hours as needed.  Dispense: 45 tablet; Refill: 0 - tiZANidine (ZANAFLEX) 4 MG tablet; Take 1 tablet (4 mg total) by mouth every 8 (eight) hours as needed for muscle spasms.  Dispense: 90 tablet; Refill: 0   Plan of Care  I am having Luke Russo start on tiZANidine. I am also having him maintain his lovastatin, gemfibrozil, omeprazole, ALPRAZolam, fluticasone, Farxiga, vitamin B-12, Dulaglutide, insulin aspart protamine - aspart, meloxicam, naloxone, ondansetron, Soliqua, Segluromet, and traMADol.  Pharmacotherapy (Medications Ordered): Meds ordered this encounter  Medications  . traMADol (ULTRAM) 50 MG tablet    Sig: Take 1 tablet (50 mg total) by mouth every 12 (twelve) hours as needed.    Dispense:  45 tablet    Refill:  0  . tiZANidine (ZANAFLEX) 4 MG tablet    Sig: Take 1 tablet (4 mg total) by mouth every 8 (eight) hours as needed for muscle spasms.    Dispense:  90 tablet    Refill:  0    Do not place this medication, or any other prescription from our practice, on "Automatic Refill". Patient may have prescription filled one day early if pharmacy is closed on scheduled refill date.   Orders:  Orders Placed This Encounter  Procedures  . INTERCOSTAL NERVE BLOCK    Standing Status:   Standing    Number of Occurrences:   1    Standing Expiration Date:   09/25/2020    Scheduling  Instructions:     Side: Bilateral T10, T11, T12     Sedation: With Sedation.     TIMEFRAME: PRN procedure. (Luke Russo will call when needed.)    Order Specific Question:   Where will this procedure be performed?    Answer:   [facility]   Follow-up plan:   Return if symptoms worsen or fail to improve.     Status post bilateral T10, T11, T12 intercostal nerve block which was effective, consider repeating, also consider pulsed RFA.   Recent Visits Date Type Provider Dept  02/19/19 Procedure visit Gillis Santa, MD [facility]  01/18/19 Office Visit Gillis Santa, MD [facility]  Showing recent visits within past 90 days and meeting all other requirements   Today's Visits Date Type Provider Dept  03/26/19 Office Visit Gillis Santa, MD [facility]  Showing today's visits and meeting all other requirements   Future Appointments No visits were found meeting these conditions.  Showing future appointments within next 90 days and meeting all other requirements  I discussed the assessment and treatment plan with the patient. The patient was provided an opportunity to ask questions and all were answered. The patient agreed with the plan and demonstrated an understanding of the instructions.  Patient advised to call back or seek an in-person evaluation if the symptoms or condition worsens.  Total duration of non-face-to-face encounter:25 minutes.  Note by: Gillis Santa, MD Date: 03/26/2019; Time: 12:56 PM  Note: This dictation was prepared with Dragon dictation. Any transcriptional errors that may result from this process are unintentional.  Disclaimer:  * Given the special circumstances of the COVID-19 pandemic, the federal government has announced that the Office for Civil Rights (OCR) will exercise its enforcement discretion and will not impose penalties on physicians using telehealth in the event of noncompliance with regulatory requirements  under the [place] and [place] (HIPAA) in connection with the good faith provision of telehealth during the WKGSU-11 national public health emergency. ([place])

## 2019-04-24 ENCOUNTER — Other Ambulatory Visit: Payer: Self-pay | Admitting: Student in an Organized Health Care Education/Training Program

## 2019-04-24 DIAGNOSIS — R0781 Pleurodynia: Secondary | ICD-10-CM

## 2019-04-24 DIAGNOSIS — G894 Chronic pain syndrome: Secondary | ICD-10-CM

## 2019-09-05 ENCOUNTER — Other Ambulatory Visit: Payer: Self-pay

## 2019-09-05 ENCOUNTER — Encounter (INDEPENDENT_AMBULATORY_CARE_PROVIDER_SITE_OTHER): Payer: BC Managed Care – PPO | Admitting: Ophthalmology

## 2019-09-05 DIAGNOSIS — D3132 Benign neoplasm of left choroid: Secondary | ICD-10-CM

## 2019-09-05 DIAGNOSIS — H2513 Age-related nuclear cataract, bilateral: Secondary | ICD-10-CM

## 2019-09-05 DIAGNOSIS — H43813 Vitreous degeneration, bilateral: Secondary | ICD-10-CM

## 2019-09-05 DIAGNOSIS — E113512 Type 2 diabetes mellitus with proliferative diabetic retinopathy with macular edema, left eye: Secondary | ICD-10-CM

## 2019-09-05 DIAGNOSIS — E11311 Type 2 diabetes mellitus with unspecified diabetic retinopathy with macular edema: Secondary | ICD-10-CM | POA: Diagnosis not present

## 2019-09-05 DIAGNOSIS — H35033 Hypertensive retinopathy, bilateral: Secondary | ICD-10-CM

## 2019-09-05 DIAGNOSIS — I10 Essential (primary) hypertension: Secondary | ICD-10-CM | POA: Diagnosis not present

## 2019-09-05 DIAGNOSIS — E113311 Type 2 diabetes mellitus with moderate nonproliferative diabetic retinopathy with macular edema, right eye: Secondary | ICD-10-CM | POA: Diagnosis not present

## 2019-10-03 ENCOUNTER — Encounter (INDEPENDENT_AMBULATORY_CARE_PROVIDER_SITE_OTHER): Payer: BC Managed Care – PPO | Admitting: Ophthalmology

## 2019-10-03 ENCOUNTER — Other Ambulatory Visit: Payer: Self-pay

## 2019-10-03 DIAGNOSIS — E113513 Type 2 diabetes mellitus with proliferative diabetic retinopathy with macular edema, bilateral: Secondary | ICD-10-CM

## 2019-10-03 DIAGNOSIS — D3132 Benign neoplasm of left choroid: Secondary | ICD-10-CM | POA: Diagnosis not present

## 2019-10-03 DIAGNOSIS — H43813 Vitreous degeneration, bilateral: Secondary | ICD-10-CM

## 2019-10-03 DIAGNOSIS — H2513 Age-related nuclear cataract, bilateral: Secondary | ICD-10-CM

## 2019-10-03 DIAGNOSIS — E11311 Type 2 diabetes mellitus with unspecified diabetic retinopathy with macular edema: Secondary | ICD-10-CM

## 2019-10-22 ENCOUNTER — Other Ambulatory Visit: Payer: Self-pay | Admitting: Neurology

## 2019-10-22 ENCOUNTER — Other Ambulatory Visit (HOSPITAL_COMMUNITY): Payer: Self-pay | Admitting: Neurology

## 2019-10-22 DIAGNOSIS — M5410 Radiculopathy, site unspecified: Secondary | ICD-10-CM

## 2019-10-31 ENCOUNTER — Encounter (INDEPENDENT_AMBULATORY_CARE_PROVIDER_SITE_OTHER): Payer: BC Managed Care – PPO | Admitting: Ophthalmology

## 2019-10-31 DIAGNOSIS — E11311 Type 2 diabetes mellitus with unspecified diabetic retinopathy with macular edema: Secondary | ICD-10-CM | POA: Diagnosis not present

## 2019-10-31 DIAGNOSIS — E113513 Type 2 diabetes mellitus with proliferative diabetic retinopathy with macular edema, bilateral: Secondary | ICD-10-CM | POA: Diagnosis not present

## 2019-10-31 DIAGNOSIS — D3132 Benign neoplasm of left choroid: Secondary | ICD-10-CM | POA: Diagnosis not present

## 2019-10-31 DIAGNOSIS — H43813 Vitreous degeneration, bilateral: Secondary | ICD-10-CM

## 2019-11-01 ENCOUNTER — Ambulatory Visit: Payer: BC Managed Care – PPO

## 2019-11-29 ENCOUNTER — Encounter (INDEPENDENT_AMBULATORY_CARE_PROVIDER_SITE_OTHER): Payer: BC Managed Care – PPO | Admitting: Ophthalmology

## 2019-11-29 DIAGNOSIS — E113513 Type 2 diabetes mellitus with proliferative diabetic retinopathy with macular edema, bilateral: Secondary | ICD-10-CM

## 2019-11-29 DIAGNOSIS — D3132 Benign neoplasm of left choroid: Secondary | ICD-10-CM | POA: Diagnosis not present

## 2019-11-29 DIAGNOSIS — E11311 Type 2 diabetes mellitus with unspecified diabetic retinopathy with macular edema: Secondary | ICD-10-CM

## 2019-11-29 DIAGNOSIS — H43813 Vitreous degeneration, bilateral: Secondary | ICD-10-CM | POA: Diagnosis not present

## 2019-12-11 ENCOUNTER — Ambulatory Visit
Admission: RE | Admit: 2019-12-11 | Discharge: 2019-12-11 | Disposition: A | Discharge status: Home / Self Care | Payer: BC Managed Care – PPO | Source: Ambulatory Visit | Attending: Neurology | Admitting: Neurology

## 2019-12-11 ENCOUNTER — Other Ambulatory Visit: Payer: Self-pay

## 2019-12-11 DIAGNOSIS — M5410 Radiculopathy, site unspecified: Secondary | ICD-10-CM | POA: Diagnosis not present

## 2019-12-11 MED ORDER — GADOBUTROL 1 MMOL/ML IV SOLN
7.0000 mL | Freq: Once | INTRAVENOUS | Status: AC | PRN
  Administered 2019-12-11: 17:00:00 7 mL via INTRAVENOUS

## 2019-12-24 ENCOUNTER — Encounter (INDEPENDENT_AMBULATORY_CARE_PROVIDER_SITE_OTHER): Payer: BC Managed Care – PPO | Admitting: Ophthalmology

## 2019-12-24 ENCOUNTER — Other Ambulatory Visit: Payer: Self-pay

## 2019-12-24 DIAGNOSIS — E113512 Type 2 diabetes mellitus with proliferative diabetic retinopathy with macular edema, left eye: Secondary | ICD-10-CM | POA: Diagnosis not present

## 2019-12-24 DIAGNOSIS — E11311 Type 2 diabetes mellitus with unspecified diabetic retinopathy with macular edema: Secondary | ICD-10-CM | POA: Diagnosis not present

## 2019-12-28 ENCOUNTER — Encounter (INDEPENDENT_AMBULATORY_CARE_PROVIDER_SITE_OTHER): Payer: BC Managed Care – PPO | Admitting: Ophthalmology

## 2019-12-28 ENCOUNTER — Other Ambulatory Visit: Payer: Self-pay

## 2019-12-28 DIAGNOSIS — E11311 Type 2 diabetes mellitus with unspecified diabetic retinopathy with macular edema: Secondary | ICD-10-CM | POA: Diagnosis not present

## 2019-12-28 DIAGNOSIS — E113513 Type 2 diabetes mellitus with proliferative diabetic retinopathy with macular edema, bilateral: Secondary | ICD-10-CM

## 2019-12-28 DIAGNOSIS — H2513 Age-related nuclear cataract, bilateral: Secondary | ICD-10-CM

## 2019-12-28 DIAGNOSIS — H43813 Vitreous degeneration, bilateral: Secondary | ICD-10-CM

## 2019-12-28 DIAGNOSIS — D3132 Benign neoplasm of left choroid: Secondary | ICD-10-CM

## 2019-12-31 ENCOUNTER — Encounter (INDEPENDENT_AMBULATORY_CARE_PROVIDER_SITE_OTHER): Payer: BC Managed Care – PPO | Admitting: Ophthalmology

## 2020-01-10 ENCOUNTER — Other Ambulatory Visit: Payer: Self-pay

## 2020-01-10 ENCOUNTER — Encounter (INDEPENDENT_AMBULATORY_CARE_PROVIDER_SITE_OTHER): Payer: BC Managed Care – PPO | Admitting: Ophthalmology

## 2020-01-10 DIAGNOSIS — E11311 Type 2 diabetes mellitus with unspecified diabetic retinopathy with macular edema: Secondary | ICD-10-CM

## 2020-01-10 DIAGNOSIS — E113513 Type 2 diabetes mellitus with proliferative diabetic retinopathy with macular edema, bilateral: Secondary | ICD-10-CM

## 2020-02-07 ENCOUNTER — Other Ambulatory Visit: Payer: Self-pay

## 2020-02-07 ENCOUNTER — Encounter (INDEPENDENT_AMBULATORY_CARE_PROVIDER_SITE_OTHER): Payer: BC Managed Care – PPO | Admitting: Ophthalmology

## 2020-02-07 DIAGNOSIS — E113513 Type 2 diabetes mellitus with proliferative diabetic retinopathy with macular edema, bilateral: Secondary | ICD-10-CM | POA: Diagnosis not present

## 2020-02-07 DIAGNOSIS — H43813 Vitreous degeneration, bilateral: Secondary | ICD-10-CM

## 2020-02-07 DIAGNOSIS — D3132 Benign neoplasm of left choroid: Secondary | ICD-10-CM

## 2020-03-06 ENCOUNTER — Encounter (INDEPENDENT_AMBULATORY_CARE_PROVIDER_SITE_OTHER): Payer: BC Managed Care – PPO | Admitting: Ophthalmology

## 2020-03-17 ENCOUNTER — Encounter (INDEPENDENT_AMBULATORY_CARE_PROVIDER_SITE_OTHER): Payer: BC Managed Care – PPO | Admitting: Ophthalmology

## 2020-03-19 ENCOUNTER — Ambulatory Visit: Payer: BC Managed Care – PPO | Admitting: Physical Therapy

## 2020-03-21 ENCOUNTER — Other Ambulatory Visit: Payer: Self-pay

## 2020-03-21 ENCOUNTER — Encounter (INDEPENDENT_AMBULATORY_CARE_PROVIDER_SITE_OTHER): Payer: BC Managed Care – PPO | Admitting: Ophthalmology

## 2020-03-21 DIAGNOSIS — E11311 Type 2 diabetes mellitus with unspecified diabetic retinopathy with macular edema: Secondary | ICD-10-CM

## 2020-03-21 DIAGNOSIS — H43813 Vitreous degeneration, bilateral: Secondary | ICD-10-CM | POA: Diagnosis not present

## 2020-03-21 DIAGNOSIS — E113513 Type 2 diabetes mellitus with proliferative diabetic retinopathy with macular edema, bilateral: Secondary | ICD-10-CM

## 2020-03-21 DIAGNOSIS — D3132 Benign neoplasm of left choroid: Secondary | ICD-10-CM | POA: Diagnosis not present

## 2020-03-21 DIAGNOSIS — H2513 Age-related nuclear cataract, bilateral: Secondary | ICD-10-CM

## 2020-03-24 ENCOUNTER — Other Ambulatory Visit: Payer: Self-pay

## 2020-03-24 ENCOUNTER — Encounter: Payer: Self-pay | Admitting: Physical Therapy

## 2020-03-24 ENCOUNTER — Ambulatory Visit: Payer: BC Managed Care – PPO | Attending: Neurology | Admitting: Physical Therapy

## 2020-03-24 DIAGNOSIS — M546 Pain in thoracic spine: Secondary | ICD-10-CM | POA: Diagnosis not present

## 2020-03-24 DIAGNOSIS — R293 Abnormal posture: Secondary | ICD-10-CM | POA: Diagnosis present

## 2020-03-24 NOTE — Therapy (Signed)
[facility] [address], [place], [address] Phone: [phone]   Fax:  [phone]  Physical Therapy Evaluation  Patient Details  Name: Luke Russo MRN: [id number] Date of Birth: [birth year] No data recorded  Encounter Date: 03/24/2020   PT End of Session - 03/24/20 1014    Visit Number 1    Number of Visits 17    Date for PT Re-Evaluation 05/19/20    PT Start Time 0957    PT Stop Time 1045    PT Time Calculation (min) 48 min    Activity Tolerance Patient tolerated treatment well    Behavior During Therapy [facility] for tasks assessed/performed           Past Medical History:  Diagnosis Date  . Diabetes mellitus without complication ([place])   . History of hiatal hernia   . Hyperlipidemia     Past Surgical History:  Procedure Laterality Date  . APPENDECTOMY      There were no vitals filed for this visit.      OBJECTIVE  Mental Status Patient is oriented to person, place and time.  Recent memory is intact.  Remote memory is intact.  Attention span and concentration are intact.  Expressive speech is intact.  Patient's fund of knowledge is within normal limits for educational level.  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 Concordant pain sign to mid thoracic CPAs and palpation to lateral rib cage (bilat) segments 5-8 with facial grimace   Strength (out of 5) R/L 5/5 Hip flexion 5/5 Hip ER 5/5 Hip IR 5/5 Hip abduction 5/5 Hip adduction 5/5 Hip  extension 5/5 Shoulder flex 5/5 Shoulder abd 5/5 Shoulder IR 5/5 Shoulder ER 5 Shoulder ext 3/4- Y lower traps 3+/4- T periscapular 5 Trunk extension 5/5 Trunk rotation  *Indicates pain   AROM (degrees) R/L (all movements include overpressure unless otherwise stated) Lumbar forward flexion (65):  WNL Lumbar extension (30): mildly limited Thoracic extension 75% limited Lumbar lateral flexion (25): WNL bilat Thoracic and Lumbar rotation (30 degrees):  approx 20d bilat *pain with combined flex + rotation each direction to ipsilateral lateral ribs All hip motion WNL *Indicates pain   PROM (degrees) PROM = AROM R/L (all movements include overpressure unless otherwise stated)  Repeated Movements Pain reduction with cobra and seated thoracic ext on foam roll   Muscle Length Shortened pec minor and cervical occipitals bilat   Passive Accessory Intervertebral Motion (PAIVM) Reproduction of back pain with CPA  and UPA bilaterally T5-8 Generally hypomobile throughout  Passive Physiological Intervertebral Motion (PPIVM) Normal flexion and extension with PPIVM testing   SPECIAL TESTS Lumbar Radiculopathy and Discogenic: Centralization and Peripheralization (SN 92, -LR 0.12): Positive centralization with thoracic ext Slump (SN 83, -LR 0.32):Negative bilat SLR (SN 92, -LR 0.29): Negative bilat  Facet Joint: Extension-Rotation (SN 100, -LR 0.0):Positive bilat for increased rib pain more on R side with flex + L rotation    Functional Tasks Squat: good squat technique Lifting: increased thoracic ext with reaching to pick up object from floor and decreased hip activation, increased use of lumbar  extensors  Ther-Ex PT reviewed the following HEP with patient with patient able to demonstrate a set of the following with min cuing for correction needed. PT educated patient on parameters of therex (how/when to inc/decrease intensity, frequency, rep/set range, stretch hold time, and  purpose of therex) with verbalized understanding. Education on postural dysfunction and length/tension relationship involved, and rib cage expansion/stretching of intercostals through breath control  Access Code: [email or web address] Seated Thoracic Extension AROM - 8 x daily - 7 x weekly - 10 reps - 2sec hold Cat-Camel - 3 x daily - 7 x weekly - 20 reps Doorway Pec Stretch at 90 Degrees Abduction - 3 x daily - 7 x weekly - 30sec hold     Objective measurements completed on examination: See above findings.                 PT Short Term Goals - 03/25/20 1322      PT SHORT TERM GOAL #1   Title Pt will be independent with HEP in order to improve strength and decrease back pain in order to improve pain-free function at home and work.    Baseline 03/25/20 HEP given    Time 4    Period Weeks    Status New             PT Long Term Goals - 03/25/20 1326      PT LONG TERM GOAL #1   Title Patient will increase FOTO score to 55 to demonstrate predicted increase in functional mobility to complete ADLs    Baseline 03/24/20 45    Time 8    Period Weeks    Status New      PT LONG TERM GOAL #2   Title Pt will decrease worst back pain as reported on NPRS by at least 2 points in order to demonstrate clinically significant reduction in back pain.    Baseline 03/25/20 5/10    Time 8    Period Weeks    Status New      PT LONG TERM GOAL #3   Title Patient will increase periscapular strength to at least 4+/5 bilat to demonstrate strength needed for heavy ADLs, dog walking, and for postural restoration    Baseline 03/25/20 R/L Y 3/4- T 3+/4- T 3+/4-    Time 8    Period Weeks    Status New                  Plan - 03/25/20 1132    Clinical Impression Statement Pt is a 54 year old male presenting with thoracic and bilat rib cage pain >2years. Impairments in thoracic mobility, rib cage expansion, breath control, postural dysfunction, soft tissue restrictions, fear avoidance beliefs, and  pain. Activity limitations in pushing, pulling, lifting, walking his dog; limiting participation in household ADLs and returning to the workforce. Patient will benefit from skilled PT to address these impairments to return patient to PLOF and increase independence.    Personal Factors and Comorbidities Age;Comorbidity 1;Past/Current Experience;Time since onset of injury/illness/exacerbation    Comorbidities DM2, HL    Examination-Activity Limitations Lift;Carry;Stand;Caring for Others    Examination-Participation Restrictions Community Activity;Cleaning;Church    Stability/Clinical Decision Making Evolving/Moderate complexity    Clinical Decision Making Moderate    Rehab Potential Good    PT Frequency 2x / week    PT Duration 8 weeks    PT Treatment/Interventions ADLs/Self Care Home Management;Electrical Stimulation;Moist Heat;Traction;Iontophoresis 4mg /ml Dexamethasone;Ultrasound;Functional mobility training;Therapeutic activities;Therapeutic exercise;Neuromuscular re-education;Patient/family education;Manual techniques;Scar mobilization;Passive range  of motion;Dry needling;Spinal Manipulations;Joint Manipulations;Taping    PT Next Visit Plan breath control, increased thoracic ext, pain science    PT Home Exercise Plan Thoracic ext, cat-camel, doorway pec stretch    Consulted and Agree with Plan of Care Patient           Patient will benefit from skilled therapeutic intervention in order to improve the following deficits and impairments:  Abnormal gait, Decreased activity tolerance, Decreased coordination, Decreased mobility, Decreased range of motion, Difficulty walking, Decreased strength, Hypomobility, Increased fascial restricitons, Impaired flexibility, Impaired tone, Postural dysfunction, Pain, Improper body mechanics, Impaired perceived functional ability, Impaired UE functional use  Visit Diagnosis: Pain in thoracic spine  Abnormal posture     Problem List Patient Active  Problem List   Diagnosis Date Noted  . Evaluation by psychiatric service required 03/22/2019  . 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 Rib pain (bilteral, no trauma, inciting event) 01/18/2019  . Cervical spondylosis 01/18/2019  . Cervical radiculopathy at C7 01/18/2019  . Chronic pain syndrome 01/18/2019  . Background diabetic retinopathy associated with diabetes mellitus due to underlying condition ([place]) 01/17/2019  . H/O hiatal hernia 07/11/2015  . Diabetes mellitus type 2, uncomplicated ([place]) 24/23/5361  . Hyperlipidemia 07/23/2014   Durwin Reges DPT Durwin Reges 03/25/2020, 5:20 PM  [facility] [address], [place], [address] Phone: [phone]   Fax:  [phone]  Name: Luke Russo MRN: [id number] Date of Birth: 04-03-1966

## 2020-03-25 ENCOUNTER — Encounter: Payer: BC Managed Care – PPO | Admitting: Physical Therapy

## 2020-03-26 ENCOUNTER — Encounter: Payer: Self-pay | Admitting: Physical Therapy

## 2020-03-26 ENCOUNTER — Other Ambulatory Visit: Payer: Self-pay

## 2020-03-26 ENCOUNTER — Ambulatory Visit: Payer: BC Managed Care – PPO | Admitting: Physical Therapy

## 2020-03-26 DIAGNOSIS — M546 Pain in thoracic spine: Secondary | ICD-10-CM | POA: Diagnosis not present

## 2020-03-26 DIAGNOSIS — R293 Abnormal posture: Secondary | ICD-10-CM

## 2020-03-26 NOTE — Therapy (Signed)
[facility] [address] [place], [address] Phone: [phone]   Fax:  [phone]  Physical Therapy Treatment  Patient Details  Name: Luke Russo MRN: [id number] Date of Birth: 28-Mar-1966 No data recorded  Encounter Date: 03/26/2020   PT End of Session - 03/26/20 1410    Visit Number 2    Number of Visits 17    Date for PT Re-Evaluation 05/19/20    PT Start Time 0150    PT Stop Time 0230    PT Time Calculation (min) 40 min    Activity Tolerance Patient tolerated treatment well    Behavior During Therapy [facility] for tasks assessed/performed           Past Medical History:  Diagnosis Date  . Diabetes mellitus without complication ([place])   . History of hiatal hernia   . Hyperlipidemia     Past Surgical History:  Procedure Laterality Date  . APPENDECTOMY      There were no vitals filed for this visit.   Subjective Assessment - 03/26/20 1356    Subjective Patient reports that he went to the chiropractor and had his back put "back in place" which made it feel a little better. PT educated patient on this terminology, that he is not "out of place", but there are benefits to decrease pain from neurophysiological response; verbalized understanding. Patient reports 5/10 pain today. Reports he was very busy and unable to focus on HEP like he would like this week, but that he has been attempting thoracic ext    Pertinent History Pt is a 54 year old male presenting with mid-back pain >2 years, reports he thinks it started after running over a 2x4 with a forklift. Patient reports that he has a disc buldge that he is having a chiropractor "pop into place". Reports he has dull pain constantly in ribs and midback, but it will be tight/cramping with pushing/pulling, lifting, prolonged walking >80mins walking dog. He reports that he gets cramping sensation in the ribs when he is sitting and reaching forward toward dogs.  Endorses that he had an unknown sickness prior to this starting and no one has been able to tell him what was wrong, but that he was hospitalized and lost a lot of weight. 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 He does not want to do physical activity for work or recreation becuase his neurologist advised him against it. He is ind with ADLs, but does not vacuum, wash the car, or complete yard work d/t staying away from pain per neurologist recommendation. Reports he has not been working for the past 2 years d/t trying to "build himself back up physically". Reports worst pain 5/10 best 2/10. Pt denies N/V, B&B changes, unexplained weight fluctuation, saddle paresthesia, fever, night sweats, or unrelenting night pain at this time.    Limitations Lifting;Walking;House hold activities    How long can you sit comfortably? 1 hour    How long can you stand comfortably? 1 hour    How long can you walk comfortably? 74mins    Diagnostic tests 12/12/19 Mild disc bulging at T2-3 and T7-8, with minimal cord flatteningat the T7-8 level. No significant spinal stenosis. Otherwise negative MRI of the thoracic spine. No other significant disc pathology, stenosis, or evidence for neural impingement.  Patient Stated Goals Decrease pain    Pain Descriptors / Indicators Pressure    Pain Onset 1 to 4 weeks ago              Ther-Ex Seated Thoracic Extension x12 with cuing for breath control Cat-Camel x12 with cuing for breath control with good carry over, reports some increased pain in midback Open book x12 bilat with no UE use with L rotation d/t L shoulder pain Supine pec stretch with foam roll at T7 32min hold Standing row 10# 3x 10 with emphasis on scauplar retraction with good carry over Seated childs pose on ball 5min; to left 38min  Manual T6-T9 30 bouts 2 bouts G2 + 2  bouts G3 for decreased pain and increased mobility; 4 bouts each segment in prone prop G3 During pain science education on rate of DDD in radiographs, the commonality and ability to be assystematic.  Education on pain as a mutli-modal experience, taking into account mechanical pain, experience, perception, activity, etc. Good verbalized understanding and acceptance from patient                       PT Education - 03/26/20 1408    Education Details pain science education, therex form/technique    Person(s) Educated Patient;Spouse    Methods Explanation;Demonstration;Verbal cues    Comprehension Verbalized understanding;Verbal cues required;Returned demonstration            PT Short Term Goals - 03/25/20 1322      PT SHORT TERM GOAL #1   Title Pt will be independent with HEP in order to improve strength and decrease back pain in order to improve pain-free function at home and work.    Baseline 03/25/20 HEP given    Time 4    Period Weeks    Status New             PT Long Term Goals - 03/25/20 1326      PT LONG TERM GOAL #1   Title Patient will increase FOTO score to 55 to demonstrate predicted increase in functional mobility to complete ADLs    Baseline 03/24/20 45    Time 8    Period Weeks    Status New      PT LONG TERM GOAL #2   Title Pt will decrease worst back pain as reported on NPRS by at least 2 points in order to demonstrate clinically significant reduction in back pain.    Baseline 03/25/20 5/10    Time 8    Period Weeks    Status New      PT LONG TERM GOAL #3   Title Patient will increase periscapular strength to at least 4+/5 bilat to demonstrate strength needed for heavy ADLs, dog walking, and for postural restoration    Baseline 03/25/20 R/L Y 3/4- T 3+/4- T 3+/4-    Time 8    Period Weeks    Status New                 Plan - 03/26/20 1507    Clinical Impression Statement PT initiated therex for increasd mobility and  periscapular strength for postural restoration, with manual techniques utilized to decrease pain and increase mobility with success. Patient is able to comply with all cuing for proper technique of therex with good motivation throughout session. PT will continue progression as able.    Personal Factors and Comorbidities Age;Comorbidity 1;Past/Current Experience;Time since onset of injury/illness/exacerbation    Comorbidities  DM2, HL    Examination-Activity Limitations Lift;Carry;Stand;Caring for Others    Examination-Participation Restrictions Community Activity;Cleaning;Church    Stability/Clinical Decision Making Evolving/Moderate complexity    Clinical Decision Making Moderate    Rehab Potential Good    PT Frequency 2x / week    PT Duration 8 weeks    PT Treatment/Interventions ADLs/Self Care Home Management;Electrical Stimulation;Moist Heat;Traction;Iontophoresis 4mg /ml Dexamethasone;Ultrasound;Functional mobility training;Therapeutic activities;Therapeutic exercise;Neuromuscular re-education;Patient/family education;Manual techniques;Scar mobilization;Passive range of motion;Dry needling;Spinal Manipulations;Joint Manipulations;Taping    PT Next Visit Plan breath control, increased thoracic ext, pain science    PT Home Exercise Plan Thoracic ext, cat-camel, doorway pec stretch    Consulted and Agree with Plan of Care Patient           Patient will benefit from skilled therapeutic intervention in order to improve the following deficits and impairments:  Abnormal gait, Decreased activity tolerance, Decreased coordination, Decreased mobility, Decreased range of motion, Difficulty walking, Decreased strength, Hypomobility, Increased fascial restricitons, Impaired flexibility, Impaired tone, Postural dysfunction, Pain, Improper body mechanics, Impaired perceived functional ability, Impaired UE functional use  Visit Diagnosis: Pain in thoracic spine  Abnormal posture     Problem  List Patient Active Problem List   Diagnosis Date Noted  . Evaluation by psychiatric service required 03/22/2019  . 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 Rib pain (bilteral, no trauma, inciting event) 01/18/2019  . Cervical spondylosis 01/18/2019  . Cervical radiculopathy at C7 01/18/2019  . Chronic pain syndrome 01/18/2019  . Background diabetic retinopathy associated with diabetes mellitus due to underlying condition ([place]) 01/17/2019  . H/O hiatal hernia 07/11/2015  . Diabetes mellitus type 2, uncomplicated ([place]) 63/87/5643  . Hyperlipidemia 07/23/2014   Durwin Reges DPT Durwin Reges 03/26/2020, 3:13 PM  [facility] [address] [place], [address] Phone: [phone]   Fax:  [phone]  Name: CAEDIN MOGAN MRN: [id number] Date of Birth: [birth year]

## 2020-03-31 ENCOUNTER — Other Ambulatory Visit: Payer: Self-pay

## 2020-03-31 ENCOUNTER — Ambulatory Visit: Payer: BC Managed Care – PPO | Admitting: Physical Therapy

## 2020-03-31 ENCOUNTER — Encounter: Payer: Self-pay | Admitting: Physical Therapy

## 2020-03-31 DIAGNOSIS — M546 Pain in thoracic spine: Secondary | ICD-10-CM | POA: Diagnosis not present

## 2020-03-31 DIAGNOSIS — R293 Abnormal posture: Secondary | ICD-10-CM

## 2020-03-31 NOTE — Therapy (Signed)
[facility] [address] [place], [address] Phone: [phone]   Fax:  [phone]  Physical Therapy Treatment  Patient Details  Name: Luke Russo MRN: [id number] Date of Birth: 10/15/1965 No data recorded  Encounter Date: 03/31/2020   PT End of Session - 03/31/20 0951    Visit Number 3    Number of Visits 17    Date for PT Re-Evaluation 05/19/20    PT Start Time 0945    PT Stop Time 1025    PT Time Calculation (min) 40 min    Activity Tolerance Patient tolerated treatment well    Behavior During Therapy [facility] for tasks assessed/performed           Past Medical History:  Diagnosis Date  . Diabetes mellitus without complication ([place])   . History of hiatal hernia   . Hyperlipidemia     Past Surgical History:  Procedure Laterality Date  . APPENDECTOMY      There were no vitals filed for this visit.   Subjective Assessment - 03/31/20 0949    Subjective Patient reports he feels about the same following last session, and has been completing his HEP. Reports 3/10 pain today in ant bilat ribs, none in the back.    Pertinent History Pt is a 54 year old male presenting with mid-back pain >2 years, reports he thinks it started after running over a 2x4 with a forklift. Patient reports that he has a disc buldge that he is having a chiropractor "pop into place". Reports he has dull pain constantly in ribs and midback, but it will be tight/cramping with pushing/pulling, lifting, prolonged walking >87mins walking dog. He reports that he gets cramping sensation in the ribs when he is sitting and reaching forward toward dogs. Endorses that he had an unknown sickness prior to this starting and no one has been able to tell him what was wrong, but that he was hospitalized and lost a lot of weight. 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 He does not want to do physical activity for work or recreation becuase his neurologist advised him against it. He is ind with ADLs, but does not vacuum, wash the car, or complete yard work d/t staying away from pain per neurologist recommendation. Reports he has not been working for the past 2 years d/t trying to "build himself back up physically". Reports worst pain 5/10 best 2/10. Pt denies N/V, B&B changes, unexplained weight fluctuation, saddle paresthesia, fever, night sweats, or unrelenting night pain at this time.    Limitations Lifting;Walking;House hold activities    How long can you sit comfortably? 1 hour    How long can you stand comfortably? 1 hour    How long can you walk comfortably? 77mins    Diagnostic tests 12/12/19 Mild disc bulging at T2-3 and T7-8, with minimal cord flatteningat the T7-8 level. No significant spinal stenosis. Otherwise negative MRI of the thoracic spine. No other significant disc pathology, stenosis, or evidence for neural impingement.    Patient Stated Goals Decrease pain    Pain Onset 1 to 4 weeks ago             Ther-Ex Nustep L1 seat 8 UE 10 26min for gentle strengthening + mobility  Seated Thoracic Extension x12 with cuing  for breath control Supine pec stretch with foam roll at T7 50min hold Lat pullover in hooklying 5# DB bilat 2x 10 with TC initially for technique, and cuing for breath controlwith good carry over Diaphragmatic breathing 65min Standing row 10# 3x 10 with emphasis on scauplar retraction with good carry over Seated childs pose on ball 77min; to left 74min  Manual T6-T9 4x 30 bouts each segment G3 CPA for increased mobility; 4 bouts each segment with prone prop G3 Sidlying rib mobilization with pillows under trunk and UE overhead STM to bilat tspine paraspinals      PT Education - 03/31/20 0950    Education Details pain science education, therex form/technique     Person(s) Educated Patient    Methods Explanation;Demonstration;Verbal cues    Comprehension Verbalized understanding;Returned demonstration;Verbal cues required            PT Short Term Goals - 03/25/20 1322      PT SHORT TERM GOAL #1   Title Pt will be independent with HEP in order to improve strength and decrease back pain in order to improve pain-free function at home and work.    Baseline 03/25/20 HEP given    Time 4    Period Weeks    Status New             PT Long Term Goals - 03/25/20 1326      PT LONG TERM GOAL #1   Title Patient will increase FOTO score to 55 to demonstrate predicted increase in functional mobility to complete ADLs    Baseline 03/24/20 45    Time 8    Period Weeks    Status New      PT LONG TERM GOAL #2   Title Pt will decrease worst back pain as reported on NPRS by at least 2 points in order to demonstrate clinically significant reduction in back pain.    Baseline 03/25/20 5/10    Time 8    Period Weeks    Status New      PT LONG TERM GOAL #3   Title Patient will increase periscapular strength to at least 4+/5 bilat to demonstrate strength needed for heavy ADLs, dog walking, and for postural restoration    Baseline 03/25/20 R/L Y 3/4- T 3+/4- T 3+/4-    Time 8    Period Weeks    Status New                 Plan - 03/31/20 1150    Clinical Impression Statement PT continued maual techniques and therex for increased mobility and postural strength with success. Patient reports no pain following session (prior 3/10). Patient is able to complete therex with cuing for proper technique and discomfort threshold with good carry over. Patient responds well to education on light stretching and mobility consistently for increased rib cage expansion and thoracic mobility    Personal Factors and Comorbidities Age;Comorbidity 1;Past/Current Experience;Time since onset of injury/illness/exacerbation    Comorbidities DM2, HL    Examination-Activity  Limitations Lift;Carry;Stand;Caring for Others    Examination-Participation Restrictions Community Activity;Cleaning;Church    Stability/Clinical Decision Making Evolving/Moderate complexity    Clinical Decision Making Moderate    Rehab Potential Good    PT Frequency 2x / week    PT Duration 8 weeks    PT Treatment/Interventions ADLs/Self Care Home Management;Electrical Stimulation;Moist Heat;Traction;Iontophoresis 4mg /ml Dexamethasone;Ultrasound;Functional mobility training;Therapeutic activities;Therapeutic exercise;Neuromuscular re-education;Patient/family education;Manual techniques;Scar mobilization;Passive range of motion;Dry needling;Spinal Manipulations;Joint Manipulations;Taping    PT Next Visit Plan  breath control, increased thoracic ext, pain science    PT Home Exercise Plan Thoracic ext, cat-camel, doorway pec stretch    Consulted and Agree with Plan of Care Patient           Patient will benefit from skilled therapeutic intervention in order to improve the following deficits and impairments:  Abnormal gait, Decreased activity tolerance, Decreased coordination, Decreased mobility, Decreased range of motion, Difficulty walking, Decreased strength, Hypomobility, Increased fascial restricitons, Impaired flexibility, Impaired tone, Postural dysfunction, Pain, Improper body mechanics, Impaired perceived functional ability, Impaired UE functional use  Visit Diagnosis: Pain in thoracic spine  Abnormal posture     Problem List Patient Active Problem List   Diagnosis Date Noted  . Evaluation by psychiatric service required 03/22/2019  . 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 Rib pain (bilteral, no trauma, inciting event) 01/18/2019  . Cervical spondylosis 01/18/2019  . Cervical radiculopathy at C7 01/18/2019  . Chronic pain syndrome 01/18/2019  . Background diabetic retinopathy associated with diabetes mellitus due to underlying condition ([place]) 01/17/2019  . H/O hiatal hernia  07/11/2015  . Diabetes mellitus type 2, uncomplicated ([place]) 83/43/7357  . Hyperlipidemia 07/23/2014   Durwin Reges DPT Durwin Reges 03/31/2020, 11:56 AM  [facility] [address] [place], [address] Phone: [phone]   Fax:  [phone]  Name: Luke Russo MRN: [id number] Date of Birth: 06-30-1966

## 2020-04-01 ENCOUNTER — Encounter: Payer: BC Managed Care – PPO | Admitting: Physical Therapy

## 2020-04-02 ENCOUNTER — Ambulatory Visit: Payer: BC Managed Care – PPO | Admitting: Physical Therapy

## 2020-04-03 ENCOUNTER — Ambulatory Visit: Payer: BC Managed Care – PPO | Admitting: Physical Therapy

## 2020-04-03 ENCOUNTER — Encounter: Payer: Self-pay | Admitting: Physical Therapy

## 2020-04-03 ENCOUNTER — Encounter: Payer: BC Managed Care – PPO | Admitting: Physical Therapy

## 2020-04-03 ENCOUNTER — Other Ambulatory Visit: Payer: Self-pay

## 2020-04-03 DIAGNOSIS — M546 Pain in thoracic spine: Secondary | ICD-10-CM

## 2020-04-03 DIAGNOSIS — R293 Abnormal posture: Secondary | ICD-10-CM

## 2020-04-03 NOTE — Therapy (Signed)
[facility] [address] [place], [address] Phone: [phone]   Fax:  [phone]  Physical Therapy Treatment  Patient Details  Name: TISHAWN FRIEDHOFF MRN: [id number] Date of Birth: [birth year] No data recorded  Encounter Date: 04/03/2020   PT End of Session - 04/03/20 1651    Visit Number 4    Number of Visits 17    Date for PT Re-Evaluation 05/19/20    PT Start Time 0445    PT Stop Time 0525    PT Time Calculation (min) 40 min    Activity Tolerance Patient tolerated treatment well    Behavior During Therapy [facility] for tasks assessed/performed           Past Medical History:  Diagnosis Date  . Diabetes mellitus without complication ([place])   . History of hiatal hernia   . Hyperlipidemia     Past Surgical History:  Procedure Laterality Date  . APPENDECTOMY      There were no vitals filed for this visit.   Subjective Assessment - 04/03/20 1649    Subjective Patient reports he is feeling pretty good today. He was sore following last session, but felt very good following and thinks rib mobilization has been the most helpful thing completed so far. Reports 2/10 pain today. He saw his MD for his L shoulder and reports he has a bone spur that he was given a cortisone shot for.    Pertinent History Pt is a 54 year old male presenting with mid-back pain >2 years, reports he thinks it started after running over a 2x4 with a forklift. Patient reports that he has a disc buldge that he is having a chiropractor "pop into place". Reports he has dull pain constantly in ribs and midback, but it will be tight/cramping with pushing/pulling, lifting, prolonged walking >55mins walking dog. He reports that he gets cramping sensation in the ribs when he is sitting and reaching forward toward dogs. Endorses that he had an unknown sickness prior to this starting and no one has been able to tell him what was wrong, but that he was  hospitalized and lost a lot of weight. 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 He does not want to do physical activity for work or recreation becuase his neurologist advised him against it. He is ind with ADLs, but does not vacuum, wash the car, or complete yard work d/t staying away from pain per neurologist recommendation. Reports he has not been working for the past 2 years d/t trying to "build himself back up physically". Reports worst pain 5/10 best 2/10. Pt denies N/V, B&B changes, unexplained weight fluctuation, saddle paresthesia, fever, night sweats, or unrelenting night pain at this time.    Limitations Lifting;Walking;House hold activities    How long can you sit comfortably? 1 hour    How long can you stand comfortably? 1 hour    How long can you walk comfortably? 20mins    Diagnostic tests 12/12/19 Mild disc bulging at T2-3 and T7-8, with minimal cord flatteningat the T7-8 level. No significant spinal stenosis. Otherwise negative MRI of the thoracic spine. No other significant disc pathology, stenosis, or evidence for neural impingement.    Patient Stated Goals Decrease pain    Pain Onset 1 to 4 weeks ago  Ther-Ex Nustep L1 seat 8 UE 10 73min for gentle strengthening + mobility  Diaphragmatic breathing 68min with patient reporting min pain  Lat pullover PVC pipe 7.5# AW 3x 10 with good carry over of spine neutral from last session Seated Thoracic Extensionx12 with cuing for breath control with good carry over  Manual T6-T9 4x 30 bouts each segment G3 CPA for increased mobility; 4 bouts each segment with prone prop G3 Sidlying rib mobilization with pillows under trunk and UE overhead STM to bilat tspine paraspinals; following Dry Needling: (2/2) 46mm .25 needles placed along the bilat tspine paraspinals approx T8 to decrease  increased muscular spasms and trigger points with the patient positioned in supine using shelving technique. Patient was educated on risks and benefits of therapy and verbally consents to PT.           PT Education - 04/03/20 1651    Education Details pain science, therex form/technique    Person(s) Educated Patient    Methods Explanation;Demonstration;Verbal cues    Comprehension Verbalized understanding;Returned demonstration;Verbal cues required            PT Short Term Goals - 03/25/20 1322      PT SHORT TERM GOAL #1   Title Pt will be independent with HEP in order to improve strength and decrease back pain in order to improve pain-free function at home and work.    Baseline 03/25/20 HEP given    Time 4    Period Weeks    Status New             PT Long Term Goals - 03/25/20 1326      PT LONG TERM GOAL #1   Title Patient will increase FOTO score to 55 to demonstrate predicted increase in functional mobility to complete ADLs    Baseline 03/24/20 45    Time 8    Period Weeks    Status New      PT LONG TERM GOAL #2   Title Pt will decrease worst back pain as reported on NPRS by at least 2 points in order to demonstrate clinically significant reduction in back pain.    Baseline 03/25/20 5/10    Time 8    Period Weeks    Status New      PT LONG TERM GOAL #3   Title Patient will increase periscapular strength to at least 4+/5 bilat to demonstrate strength needed for heavy ADLs, dog walking, and for postural restoration    Baseline 03/25/20 R/L Y 3/4- T 3+/4- T 3+/4-    Time 8    Period Weeks    Status New                 Plan - 04/03/20 1726    Clinical Impression Statement PT continued to utilize manual techniques for decreased pain and increased mobility and therex for mobility with success. PT initiated TDN with good localized twitch response. paitent is able to complete all therex with proper technique and good and good motivation throughout session. PT  will continue progression as able.    Personal Factors and Comorbidities Finances    Comorbidities DM2, HL    Examination-Activity Limitations Lift;Carry;Stand;Caring for Others    Examination-Participation Restrictions Community Activity;Cleaning;Church    Stability/Clinical Decision Making Evolving/Moderate complexity    Clinical Decision Making Moderate    Rehab Potential Good    PT Frequency 2x / week    PT Duration 8 weeks    PT Treatment/Interventions ADLs/Self Care Home  Management;Electrical Stimulation;Moist Heat;Traction;Iontophoresis 4mg /ml Dexamethasone;Ultrasound;Functional mobility training;Therapeutic activities;Therapeutic exercise;Neuromuscular re-education;Patient/family education;Manual techniques;Scar mobilization;Passive range of motion;Dry needling;Spinal Manipulations;Joint Manipulations;Taping    PT Next Visit Plan breath control, increased thoracic ext, pain science    PT Home Exercise Plan Thoracic ext, cat-camel, doorway pec stretch    Consulted and Agree with Plan of Care Patient           Patient will benefit from skilled therapeutic intervention in order to improve the following deficits and impairments:  Abnormal gait, Decreased activity tolerance, Decreased coordination, Decreased mobility, Decreased range of motion, Difficulty walking, Decreased strength, Hypomobility, Increased fascial restricitons, Impaired flexibility, Impaired tone, Postural dysfunction, Pain, Improper body mechanics, Impaired perceived functional ability, Impaired UE functional use  Visit Diagnosis: Pain in thoracic spine  Abnormal posture     Problem List Patient Active Problem List   Diagnosis Date Noted  . Evaluation by psychiatric service required 03/22/2019  . 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 Rib pain (bilteral, no trauma, inciting event) 01/18/2019  . Cervical spondylosis 01/18/2019  . Cervical radiculopathy at C7 01/18/2019  . Chronic pain syndrome 01/18/2019  .  Background diabetic retinopathy associated with diabetes mellitus due to underlying condition ([place]) 01/17/2019  . H/O hiatal hernia 07/11/2015  . Diabetes mellitus type 2, uncomplicated ([place]) 52/84/1324  . Hyperlipidemia 07/23/2014   Durwin Reges DPT  Durwin Reges 04/03/2020, 5:42 PM  [facility] [address] [place], [address] Phone: [phone]   Fax:  [phone]  Name: CAMAURI CRATON MRN: [id number] Date of Birth: 08-11-1965

## 2020-04-07 ENCOUNTER — Encounter: Payer: BC Managed Care – PPO | Admitting: Physical Therapy

## 2020-04-09 ENCOUNTER — Other Ambulatory Visit: Payer: Self-pay

## 2020-04-09 ENCOUNTER — Ambulatory Visit: Payer: BC Managed Care – PPO | Admitting: Physical Therapy

## 2020-04-09 ENCOUNTER — Ambulatory Visit: Payer: BC Managed Care – PPO | Attending: Neurology | Admitting: Physical Therapy

## 2020-04-09 ENCOUNTER — Encounter: Payer: Self-pay | Admitting: Physical Therapy

## 2020-04-09 DIAGNOSIS — M546 Pain in thoracic spine: Secondary | ICD-10-CM | POA: Insufficient documentation

## 2020-04-09 DIAGNOSIS — R293 Abnormal posture: Secondary | ICD-10-CM | POA: Diagnosis present

## 2020-04-09 NOTE — Therapy (Signed)
[facility] [address], [place], [address] Phone: [phone]   Fax:  [phone]  Physical Therapy Treatment  Patient Details  Name: Luke Russo MRN: [id number] Date of Birth: 1965-08-21 No data recorded  Encounter Date: 04/09/2020   PT End of Session - 04/09/20 1039    Visit Number 5    Number of Visits 17    Date for PT Re-Evaluation 05/19/20    PT Start Time 2979    PT Stop Time 1115    PT Time Calculation (min) 40 min    Activity Tolerance Patient tolerated treatment well    Behavior During Therapy [facility] for tasks assessed/performed           Past Medical History:  Diagnosis Date  . Diabetes mellitus without complication ([place])   . History of hiatal hernia   . Hyperlipidemia     Past Surgical History:  Procedure Laterality Date  . APPENDECTOMY      There were no vitals filed for this visit.   Subjective Assessment - 04/09/20 1037    Subjective Patient reports he had a hard time this weekend moving things from his home, and was having a lot of rib cramping. Patient reports 2/10 pain today in bilat rib cage. Reports some compliance with HEP    Pertinent History Pt is a 54 year old male presenting with mid-back pain >2 years, reports he thinks it started after running over a 2x4 with a forklift. Patient reports that he has a disc buldge that he is having a chiropractor "pop into place". Reports he has dull pain constantly in ribs and midback, but it will be tight/cramping with pushing/pulling, lifting, prolonged walking >76mins walking dog. He reports that he gets cramping sensation in the ribs when he is sitting and reaching forward toward dogs. Endorses that he had an unknown sickness prior to this starting and no one has been able to tell him what was wrong, but that he was hospitalized and lost a lot of weight. 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 He does not want to do physical activity for work or recreation becuase his neurologist advised him against it. He is ind with ADLs, but does not vacuum, wash the car, or complete yard work d/t staying away from pain per neurologist recommendation. Reports he has not been working for the past 2 years d/t trying to "build himself back up physically". Reports worst pain 5/10 best 2/10. Pt denies N/V, B&B changes, unexplained weight fluctuation, saddle paresthesia, fever, night sweats, or unrelenting night pain at this time.    Limitations Lifting;Walking;House hold activities    How long can you sit comfortably? 1 hour    How long can you stand comfortably? 1 hour    How long can you walk comfortably? 60mins    Diagnostic tests 12/12/19 Mild disc bulging at T2-3 and T7-8, with minimal cord flatteningat the T7-8 level. No significant spinal stenosis. Otherwise negative MRI of the thoracic spine. No other significant disc pathology, stenosis, or evidence for neural impingement.    Pain Onset 1 to 4 weeks ago           Ther-Ex Nustep L1 seat 8 UE 10 67min for gentle strengthening + mobility Supine BUE AAROM flex with PVC pipe 1/2 foam  under thoracic spine for increased mobility  Seated thoracic rotation x20 3sec hold with cuing to prevent breath holding Seated Thoracic Extensionx12 with cuing for breath control with good carry over Theraball rollouts sitting x12 3sec hold with cuing for breath control with good carry over Seated L childs pose on theraball 101min hold  Manual T6-T94x30 boutseach segmentG3CPAfor increased mobility; 4 bouts each segment withprone prop G3 Sidlying rib mobilization with pillows under trunk and UE overhead STM to bilat tspine paraspinals (R>L)      PT Education - 04/09/20 1039    Education Details therex form/technique    Person(s) Educated Patient    Methods  Explanation;Demonstration;Verbal cues    Comprehension Verbalized understanding;Returned demonstration;Verbal cues required            PT Short Term Goals - 03/25/20 1322      PT SHORT TERM GOAL #1   Title Pt will be independent with HEP in order to improve strength and decrease back pain in order to improve pain-free function at home and work.    Baseline 03/25/20 HEP given    Time 4    Period Weeks    Status New             PT Long Term Goals - 03/25/20 1326      PT LONG TERM GOAL #1   Title Patient will increase FOTO score to 55 to demonstrate predicted increase in functional mobility to complete ADLs    Baseline 03/24/20 45    Time 8    Period Weeks    Status New      PT LONG TERM GOAL #2   Title Pt will decrease worst back pain as reported on NPRS by at least 2 points in order to demonstrate clinically significant reduction in back pain.    Baseline 03/25/20 5/10    Time 8    Period Weeks    Status New      PT LONG TERM GOAL #3   Title Patient will increase periscapular strength to at least 4+/5 bilat to demonstrate strength needed for heavy ADLs, dog walking, and for postural restoration    Baseline 03/25/20 R/L Y 3/4- T 3+/4- T 3+/4-    Time 8    Period Weeks    Status New                 Plan - 04/09/20 1059    Clinical Impression Statement PT continued to utilize manual techniques to decrease pain and increase mobility with success. PT contiued therex progresison for increased mobility and breath control with patient able to comply with cuing for proper technique. Patient reports no pain at conclusion of session. PT will continue progression as able.    Personal Factors and Comorbidities Finances    Comorbidities DM2, HL    Examination-Activity Limitations Lift;Carry;Stand;Caring for Others    Examination-Participation Restrictions Community Activity;Cleaning;Church    Stability/Clinical Decision Making Evolving/Moderate complexity    Clinical Decision  Making Moderate    Rehab Potential Good    PT Frequency 2x / week    PT Duration 8 weeks    PT Treatment/Interventions ADLs/Self Care Home Management;Electrical Stimulation;Moist Heat;Traction;Iontophoresis 4mg /ml Dexamethasone;Ultrasound;Functional mobility training;Therapeutic activities;Therapeutic exercise;Neuromuscular re-education;Patient/family education;Manual techniques;Scar mobilization;Passive range of motion;Dry needling;Spinal Manipulations;Joint Manipulations;Taping    PT Next Visit Plan breath control, increased thoracic ext, pain science    PT Home Exercise Plan Thoracic ext, cat-camel, doorway pec stretch    Consulted and Agree with Plan of Care Patient  Patient will benefit from skilled therapeutic intervention in order to improve the following deficits and impairments:  Abnormal gait, Decreased activity tolerance, Decreased coordination, Decreased mobility, Decreased range of motion, Difficulty walking, Decreased strength, Hypomobility, Increased fascial restricitons, Impaired flexibility, Impaired tone, Postural dysfunction, Pain, Improper body mechanics, Impaired perceived functional ability, Impaired UE functional use  Visit Diagnosis: Pain in thoracic spine  Abnormal posture     Problem List Patient Active Problem List   Diagnosis Date Noted  . Evaluation by psychiatric service required 03/22/2019  . 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 Rib pain (bilteral, no trauma, inciting event) 01/18/2019  . Cervical spondylosis 01/18/2019  . Cervical radiculopathy at C7 01/18/2019  . Chronic pain syndrome 01/18/2019  . Background diabetic retinopathy associated with diabetes mellitus due to underlying condition ([place]) 01/17/2019  . H/O hiatal hernia 07/11/2015  . Diabetes mellitus type 2, uncomplicated ([place]) 96/78/9381  . Hyperlipidemia 07/23/2014   Durwin Reges DPT Durwin Reges 04/09/2020, 11:05 AM  [facility] [address], [place], [address] Phone: [phone]   Fax:  [phone]  Name: Luke Russo MRN: [id number] Date of Birth: Oct 11, 1965

## 2020-04-15 ENCOUNTER — Encounter: Payer: Self-pay | Admitting: Physical Therapy

## 2020-04-15 ENCOUNTER — Other Ambulatory Visit: Payer: Self-pay

## 2020-04-15 ENCOUNTER — Ambulatory Visit: Payer: BC Managed Care – PPO | Admitting: Physical Therapy

## 2020-04-15 DIAGNOSIS — R293 Abnormal posture: Secondary | ICD-10-CM

## 2020-04-15 DIAGNOSIS — M546 Pain in thoracic spine: Secondary | ICD-10-CM

## 2020-04-15 NOTE — Therapy (Signed)
[facility] [address], [place], [address] Phone: [phone]   Fax:  [phone]  Physical Therapy Treatment  Patient Details  Name: Luke Russo MRN: [id number] Date of Birth: Oct 31, 1965 No data recorded  Encounter Date: 04/15/2020   PT End of Session - 04/15/20 1120    Visit Number 6    Number of Visits 17    Date for PT Re-Evaluation 05/19/20    PT Start Time 1115    PT Stop Time 1155    PT Time Calculation (min) 40 min    Activity Tolerance Patient tolerated treatment well    Behavior During Therapy [facility] for tasks assessed/performed           Past Medical History:  Diagnosis Date  . Diabetes mellitus without complication ([place])   . History of hiatal hernia   . Hyperlipidemia     Past Surgical History:  Procedure Laterality Date  . APPENDECTOMY      There were no vitals filed for this visit.   Subjective Assessment - 04/15/20 1117    Subjective Patinet reports he did a lot of moving this weekend, but that he doesn't think it is too bad considering how much physical activity he did. Reports this pain is 2/10 pain. Reports better compliance with HEP.    Pertinent History Pt is a 54 year old male presenting with mid-back pain >2 years, reports he thinks it started after running over a 2x4 with a forklift. Patient reports that he has a disc buldge that he is having a chiropractor "pop into place". Reports he has dull pain constantly in ribs and midback, but it will be tight/cramping with pushing/pulling, lifting, prolonged walking >63mins walking dog. He reports that he gets cramping sensation in the ribs when he is sitting and reaching forward toward dogs. Endorses that he had an unknown sickness prior to this starting and no one has been able to tell him what was wrong, but that he was hospitalized and lost a lot of weight. 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 He does not want to do physical activity for work or recreation becuase his neurologist advised him against it. He is ind with ADLs, but does not vacuum, wash the car, or complete yard work d/t staying away from pain per neurologist recommendation. Reports he has not been working for the past 2 years d/t trying to "build himself back up physically". Reports worst pain 5/10 best 2/10. Pt denies N/V, B&B changes, unexplained weight fluctuation, saddle paresthesia, fever, night sweats, or unrelenting night pain at this time.    Limitations Lifting;Walking;House hold activities    How long can you sit comfortably? 1 hour    How long can you stand comfortably? 1 hour    How long can you walk comfortably? 55mins    Diagnostic tests 12/12/19 Mild disc bulging at T2-3 and T7-8, with minimal cord flatteningat the T7-8 level. No significant spinal stenosis. Otherwise negative MRI of the thoracic spine. No other significant disc pathology, stenosis, or evidence for neural impingement.    Pain Onset 1 to 4 weeks ago           Ther-Ex Nustep L1 seat 8 UE 10 48min for gentle strengthening + mobility Supine BUE AAROM flex with PVC pipe 1/2  foam under thoracic spine for increased mobility x12 Prone thoracic ext 3x 10 with good carry over following demo for technique (unable to complete in hands-behind-head position d/t L shoulder pain) OMEGA standing row 20# 3x 10 with min cuing for proper technique with good carry over of demo Bent over row technique x10; with squat x10; with 20#  Seated thoracic rotation x20 3sec hold with cuing to prevent breath holding Theraball rollouts sitting x12 3sec hold with cuing for breath control with good carry over; 12x L <> R   Manual T6-T94x30 boutseach segmentG3CPAfor increased mobility; 4 bouts each segment withprone prop G3 Sidlying rib mobilization with  pillows under trunk and UE overhead STM to bilat tspine paraspinals (R>L)                          PT Education - 04/15/20 1119    Education Details therex form/techniques    Person(s) Educated Patient    Methods Explanation;Demonstration;Verbal cues    Comprehension Verbalized understanding;Returned demonstration;Verbal cues required            PT Short Term Goals - 03/25/20 1322      PT SHORT TERM GOAL #1   Title Pt will be independent with HEP in order to improve strength and decrease back pain in order to improve pain-free function at home and work.    Baseline 03/25/20 HEP given    Time 4    Period Weeks    Status New             PT Long Term Goals - 03/25/20 1326      PT LONG TERM GOAL #1   Title Patient will increase FOTO score to 55 to demonstrate predicted increase in functional mobility to complete ADLs    Baseline 03/24/20 45    Time 8    Period Weeks    Status New      PT LONG TERM GOAL #2   Title Pt will decrease worst back pain as reported on NPRS by at least 2 points in order to demonstrate clinically significant reduction in back pain.    Baseline 03/25/20 5/10    Time 8    Period Weeks    Status New      PT LONG TERM GOAL #3   Title Patient will increase periscapular strength to at least 4+/5 bilat to demonstrate strength needed for heavy ADLs, dog walking, and for postural restoration    Baseline 03/25/20 R/L Y 3/4- T 3+/4- T 3+/4-    Time 8    Period Weeks    Status New                 Plan - 04/15/20 1128    Clinical Impression Statement PT continued therex progression for increased thoracic mobility and strengthening with success. Patient continues to report good relief from manual techniques as well. Patient is able to comply with all cuing for proper technique of therex following multi-modal cuing with good motivation throughout session, no increased pain. PT will continue progression as able.    Personal Factors  and Comorbidities Age;Comorbidity 1;Comorbidity 2;Fitness;Past/Current Experience;Time since onset of injury/illness/exacerbation;Profession    Comorbidities DM2, HL    Examination-Activity Limitations Lift;Carry;Stand;Caring for Others    Examination-Participation Restrictions Community Activity;Cleaning;Church    Stability/Clinical Decision Making Evolving/Moderate complexity    Clinical Decision Making Moderate    Rehab Potential Good    PT Frequency 2x / week    PT  Duration 8 weeks    PT Treatment/Interventions ADLs/Self Care Home Management;Electrical Stimulation;Moist Heat;Traction;Iontophoresis 4mg /ml Dexamethasone;Ultrasound;Functional mobility training;Therapeutic activities;Therapeutic exercise;Neuromuscular re-education;Patient/family education;Manual techniques;Scar mobilization;Passive range of motion;Dry needling;Spinal Manipulations;Joint Manipulations;Taping    PT Next Visit Plan breath control, increased thoracic ext, pain science    PT Home Exercise Plan Thoracic ext, cat-camel, doorway pec stretch    Consulted and Agree with Plan of Care Patient           Patient will benefit from skilled therapeutic intervention in order to improve the following deficits and impairments:  Abnormal gait, Decreased activity tolerance, Decreased coordination, Decreased mobility, Decreased range of motion, Difficulty walking, Decreased strength, Hypomobility, Increased fascial restricitons, Impaired flexibility, Impaired tone, Postural dysfunction, Pain, Improper body mechanics, Impaired perceived functional ability, Impaired UE functional use  Visit Diagnosis: Pain in thoracic spine  Abnormal posture     Problem List Patient Active Problem List   Diagnosis Date Noted  . Evaluation by psychiatric service required 03/22/2019  . 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 Rib pain (bilteral, no trauma, inciting event) 01/18/2019  . Cervical spondylosis 01/18/2019  . Cervical radiculopathy at  C7 01/18/2019  . Chronic pain syndrome 01/18/2019  . Background diabetic retinopathy associated with diabetes mellitus due to underlying condition ([place]) 01/17/2019  . H/O hiatal hernia 07/11/2015  . Diabetes mellitus type 2, uncomplicated ([place]) 30/04/2329  . Hyperlipidemia 07/23/2014   Durwin Reges DPT Durwin Reges 04/15/2020, 11:53 AM  [facility] [address] [place], [address] Phone: [phone]   Fax:  [phone]  Name: Luke Russo MRN: [id number] Date of Birth: 1966/01/26

## 2020-04-17 ENCOUNTER — Ambulatory Visit: Payer: BC Managed Care – PPO | Admitting: Physical Therapy

## 2020-04-17 ENCOUNTER — Other Ambulatory Visit: Payer: Self-pay

## 2020-04-17 ENCOUNTER — Encounter: Payer: Self-pay | Admitting: Physical Therapy

## 2020-04-17 DIAGNOSIS — M546 Pain in thoracic spine: Secondary | ICD-10-CM | POA: Diagnosis not present

## 2020-04-17 DIAGNOSIS — R293 Abnormal posture: Secondary | ICD-10-CM

## 2020-04-17 NOTE — Therapy (Signed)
[facility] [address] [place], [address] Phone: [phone]   Fax:  [phone]  Physical Therapy Treatment  Patient Details  Name: Luke Russo MRN: [id number] Date of Birth: 20-Sep-1965 No data recorded  Encounter Date: 04/17/2020   PT End of Session - 04/17/20 0955    Visit Number 7    Number of Visits 17    Date for PT Re-Evaluation 05/19/20    PT Start Time 0950    PT Stop Time 1030    PT Time Calculation (min) 40 min    Activity Tolerance Patient tolerated treatment well    Behavior During Therapy [facility] for tasks assessed/performed           Past Medical History:  Diagnosis Date   Diabetes mellitus without complication ([place])    History of hiatal hernia    Hyperlipidemia     Past Surgical History:  Procedure Laterality Date   APPENDECTOMY      There were no vitals filed for this visit.   Subjective Assessment - 04/17/20 0953    Subjective Patient reports a little bit of pain in bilat lateral ribcage this am, reporting it  is 3/10. Reports compliance with HEP.    Pertinent History Pt is a 54 year old male presenting with mid-back pain >2 years, reports he thinks it started after running over a 2x4 with a forklift. Patient reports that he has a disc buldge that he is having a chiropractor "pop into place". Reports he has dull pain constantly in ribs and midback, but it will be tight/cramping with pushing/pulling, lifting, prolonged walking >7mins walking dog. He reports that he gets cramping sensation in the ribs when he is sitting and reaching forward toward dogs. Endorses that he had an unknown sickness prior to this starting and no one has been able to tell him what was wrong, but that he was hospitalized and lost a lot of weight. 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 He does not want to do physical activity for work or recreation becuase his neurologist advised him against it. He is ind with ADLs, but does not vacuum, wash the car, or complete yard work d/t staying away from pain per neurologist recommendation. Reports he has not been working for the past 2 years d/t trying to "build himself back up physically". Reports worst pain 5/10 best 2/10. Pt denies N/V, B&B changes, unexplained weight fluctuation, saddle paresthesia, fever, night sweats, or unrelenting night pain at this time.    Limitations Lifting;Walking;House hold activities    How long can you sit comfortably? 1 hour    How long can you stand comfortably? 1 hour    How long can you walk comfortably? 85mins    Diagnostic tests 12/12/19 Mild disc bulging at T2-3 and T7-8, with minimal cord flatteningat the T7-8 level. No significant spinal stenosis. Otherwise negative MRI of the thoracic spine. No other significant disc pathology, stenosis, or evidence for neural impingement.    Patient Stated Goals Decrease pain    Pain Onset 1 to 4 weeks ago           Ther-Ex Nustep L1 seat 8 UE 10 79min for gentle strengthening + mobility Supine BUE AAROM flex with PVC pipe 1/2 foam under thoracic spine for increased mobility  x12 Prone thoracic ext 3x 10 with min cuing for set up, good carry over following Prone T 2# DB 3x 10/8/8 with focus on scapular retraction with pt needing to drop weight in L hand d/t L shoulder pain occasionally OMEGA bent over row 20# with cuing for set up with good carry over following Theraball rollouts sitting x12 3sec hold with cuing for breath control with good carry over; 12x L <> R   Manual T6-T96x30 boutseach segmentG3CPAfor increased mobility; Sidlying rib mobilization with pillows under trunk and UE overhead STM to bilat tspine paraspinals(R>L)          PT Education - 04/17/20 0955    Education Details therex form/technique     Person(s) Educated Patient    Methods Explanation;Demonstration;Verbal cues    Comprehension Verbalized understanding;Returned demonstration;Verbal cues required            PT Short Term Goals - 03/25/20 1322      PT SHORT TERM GOAL #1   Title Pt will be independent with HEP in order to improve strength and decrease back pain in order to improve pain-free function at home and work.    Baseline 03/25/20 HEP given    Time 4    Period Weeks    Status New             PT Long Term Goals - 03/25/20 1326      PT LONG TERM GOAL #1   Title Patient will increase FOTO score to 55 to demonstrate predicted increase in functional mobility to complete ADLs    Baseline 03/24/20 45    Time 8    Period Weeks    Status New      PT LONG TERM GOAL #2   Title Pt will decrease worst back pain as reported on NPRS by at least 2 points in order to demonstrate clinically significant reduction in back pain.    Baseline 03/25/20 5/10    Time 8    Period Weeks    Status New      PT LONG TERM GOAL #3   Title Patient will increase periscapular strength to at least 4+/5 bilat to demonstrate strength needed for heavy ADLs, dog walking, and for postural restoration    Baseline 03/25/20 R/L Y 3/4- T 3+/4- T 3+/4-    Time 8    Period Weeks    Status New                 Plan - 04/17/20 0957    Clinical Impression Statement PT continued to utilize manual techniques to reduce pain and increase mobility, and therex progression for increased postural extension/retraction strength and mobility with success. Patient is able to comply with multimodal cuing for proper technique of therex, with consistent cuing needed for breath control. PT will continue progression as able.    Personal Factors and Comorbidities Age;Comorbidity 1;Comorbidity 2;Fitness;Past/Current Experience;Time since onset of injury/illness/exacerbation;Profession    Comorbidities DM2, HL    Examination-Activity Limitations  Lift;Carry;Stand;Caring for Others    Examination-Participation Restrictions Community Activity;Cleaning;Church    Stability/Clinical Decision Making Evolving/Moderate complexity    Clinical Decision Making Moderate    Rehab Potential Good    PT Frequency 2x / week    PT Duration 8 weeks    PT Treatment/Interventions ADLs/Self Care Home Management;Electrical Stimulation;Moist Heat;Traction;Iontophoresis 4mg /ml Dexamethasone;Ultrasound;Functional mobility training;Therapeutic activities;Therapeutic exercise;Neuromuscular re-education;Patient/family education;Manual techniques;Scar mobilization;Passive range of motion;Dry needling;Spinal Manipulations;Joint Manipulations;Taping    PT Next Visit Plan breath control, increased thoracic ext, pain  science    PT Home Exercise Plan Thoracic ext, cat-camel, doorway pec stretch    Consulted and Agree with Plan of Care Patient           Patient will benefit from skilled therapeutic intervention in order to improve the following deficits and impairments:  Abnormal gait, Decreased activity tolerance, Decreased coordination, Decreased mobility, Decreased range of motion, Difficulty walking, Decreased strength, Hypomobility, Increased fascial restricitons, Impaired flexibility, Impaired tone, Postural dysfunction, Pain, Improper body mechanics, Impaired perceived functional ability, Impaired UE functional use  Visit Diagnosis: Pain in thoracic spine  Abnormal posture     Problem List Patient Active Problem List   Diagnosis Date Noted   Evaluation by psychiatric service required 03/22/2019   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 [place], [address] Phone: [phone]   Fax:  [phone]  Name: Luke Russo MRN: [id number] Date of Birth: 07/06/1966

## 2020-04-18 ENCOUNTER — Encounter (INDEPENDENT_AMBULATORY_CARE_PROVIDER_SITE_OTHER): Payer: BC Managed Care – PPO | Admitting: Ophthalmology

## 2020-04-18 DIAGNOSIS — H43813 Vitreous degeneration, bilateral: Secondary | ICD-10-CM | POA: Diagnosis not present

## 2020-04-18 DIAGNOSIS — E113513 Type 2 diabetes mellitus with proliferative diabetic retinopathy with macular edema, bilateral: Secondary | ICD-10-CM | POA: Diagnosis not present

## 2020-04-18 DIAGNOSIS — D3132 Benign neoplasm of left choroid: Secondary | ICD-10-CM | POA: Diagnosis not present

## 2020-04-18 DIAGNOSIS — E11311 Type 2 diabetes mellitus with unspecified diabetic retinopathy with macular edema: Secondary | ICD-10-CM | POA: Diagnosis not present

## 2020-04-21 ENCOUNTER — Other Ambulatory Visit: Payer: Self-pay

## 2020-04-21 ENCOUNTER — Encounter: Payer: Self-pay | Admitting: Physical Therapy

## 2020-04-21 ENCOUNTER — Ambulatory Visit: Payer: BC Managed Care – PPO | Admitting: Physical Therapy

## 2020-04-21 DIAGNOSIS — M546 Pain in thoracic spine: Secondary | ICD-10-CM

## 2020-04-21 DIAGNOSIS — R293 Abnormal posture: Secondary | ICD-10-CM

## 2020-04-21 NOTE — Therapy (Signed)
[facility] [address], [place], [address] Phone: [phone]   Fax:  [phone]  Physical Therapy Treatment  Patient Details  Name: Luke Russo MRN: [id number] Date of Birth: June 14, 1966 No data recorded  Encounter Date: 04/21/2020   PT End of Session - 04/21/20 1126    Visit Number 8    Number of Visits 17    Date for PT Re-Evaluation 05/19/20    PT Start Time 1120    PT Stop Time 1200    PT Time Calculation (min) 40 min    Activity Tolerance Patient tolerated treatment well    Behavior During Therapy [facility] for tasks assessed/performed           Past Medical History:  Diagnosis Date  . Diabetes mellitus without complication ([place])   . History of hiatal hernia   . Hyperlipidemia     Past Surgical History:  Procedure Laterality Date  . APPENDECTOMY      There were no vitals filed for this visit.   Subjective Assessment - 04/21/20 1123    Subjective Patient reports he iced and completed HEP over the weekend. Reports minimal back pain, mostly rib pain L>R 3/10 and dull in nature.    Pertinent History Pt is a 54 year old male presenting with mid-back pain >2 years, reports he thinks it started after running over a 2x4 with a forklift. Patient reports that he has a disc buldge that he is having a chiropractor "pop into place". Reports he has dull pain constantly in ribs and midback, but it will be tight/cramping with pushing/pulling, lifting, prolonged walking >41mins walking dog. He reports that he gets cramping sensation in the ribs when he is sitting and reaching forward toward dogs. Endorses that he had an unknown sickness prior to this starting and no one has been able to tell him what was wrong, but that he was hospitalized and lost a lot of weight. 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 He does not want to do physical activity for work or recreation becuase his neurologist advised him against it. He is ind with ADLs, but does not vacuum, wash the car, or complete yard work d/t staying away from pain per neurologist recommendation. Reports he has not been working for the past 2 years d/t trying to "build himself back up physically". Reports worst pain 5/10 best 2/10. Pt denies N/V, B&B changes, unexplained weight fluctuation, saddle paresthesia, fever, night sweats, or unrelenting night pain at this time.    Limitations Lifting;Walking;House hold activities    How long can you sit comfortably? 1 hour    How long can you stand comfortably? 1 hour    How long can you walk comfortably? 20mins    Diagnostic tests 12/12/19 Mild disc bulging at T2-3 and T7-8, with minimal cord flatteningat the T7-8 level. No significant spinal stenosis. Otherwise negative MRI of the thoracic spine. No other significant disc pathology, stenosis, or evidence for neural impingement.    Patient Stated Goals Decrease pain    Pain Onset 1 to 4 weeks ago             Ther-Ex Nustep L2 seat 8 UE 10 61min for gentle strengthening + mobility Supine BUE AAROM flex with PVC pipe full foam under thoracic spine  for increased mobilityx15; cuing for breath control with good carry over Prone thoracic ext with head and shoulders off table > progressing to chest off table 3x 10 with better mobility this session  Palloff with rotation 5# 2x 10 bilat with cuing for breath control, and eccentric control with good carry over Band pull aparts, scap retraction BluTB 3x 10 with min cuing initially for posture with good carry over Theraball rollouts sitting x12 3sec hold with cuing for breath control with good carry over; 12x L <> R  Manual T6-T96x30 boutseach segmentG3CPAfor increased mobility; Sidlying L rib mobilization with pillows under trunk and UE overhead STM to bilat tspine  paraspinals(R>L)                PT Education - 04/21/20 1126    Education Details therex form/technique    Person(s) Educated Patient    Methods Explanation;Demonstration;Verbal cues    Comprehension Verbalized understanding;Returned demonstration;Verbal cues required            PT Short Term Goals - 03/25/20 1322      PT SHORT TERM GOAL #1   Title Pt will be independent with HEP in order to improve strength and decrease back pain in order to improve pain-free function at home and work.    Baseline 03/25/20 HEP given    Time 4    Period Weeks    Status New             PT Long Term Goals - 03/25/20 1326      PT LONG TERM GOAL #1   Title Patient will increase FOTO score to 55 to demonstrate predicted increase in functional mobility to complete ADLs    Baseline 03/24/20 45    Time 8    Period Weeks    Status New      PT LONG TERM GOAL #2   Title Pt will decrease worst back pain as reported on NPRS by at least 2 points in order to demonstrate clinically significant reduction in back pain.    Baseline 03/25/20 5/10    Time 8    Period Weeks    Status New      PT LONG TERM GOAL #3   Title Patient will increase periscapular strength to at least 4+/5 bilat to demonstrate strength needed for heavy ADLs, dog walking, and for postural restoration    Baseline 03/25/20 R/L Y 3/4- T 3+/4- T 3+/4-    Time 8    Period Weeks    Status New                 Plan - 04/21/20 1133    Clinical Impression Statement PT continued to utilize manual techniques for pain reduction and progress mobility/strength with success. Patient with cuing needed for breath control and technique throughout therex with decent carry over. Patient pain focused throughout session, accepting of education on muscle soreness from proper activation during therex and true pain. PT will continue progression as able.    Personal Factors and Comorbidities Age;Comorbidity 1;Comorbidity  2;Fitness;Past/Current Experience;Time since onset of injury/illness/exacerbation;Profession    Comorbidities DM2, HL    Examination-Activity Limitations Lift;Carry;Stand;Caring for Others    Examination-Participation Restrictions Community Activity;Cleaning;Church    Stability/Clinical Decision Making Evolving/Moderate complexity    Clinical Decision Making Moderate    Rehab Potential Good    PT Frequency 2x / week    PT Duration 8 weeks    PT Treatment/Interventions ADLs/Self Care Home Management;Electrical Stimulation;Moist Heat;Traction;Iontophoresis 4mg /ml Dexamethasone;Ultrasound;Functional mobility training;Therapeutic  activities;Therapeutic exercise;Neuromuscular re-education;Patient/family education;Manual techniques;Scar mobilization;Passive range of motion;Dry needling;Spinal Manipulations;Joint Manipulations;Taping    PT Next Visit Plan breath control, increased thoracic ext, pain science    PT Home Exercise Plan Thoracic ext, cat-camel, doorway pec stretch    Consulted and Agree with Plan of Care Patient           Patient will benefit from skilled therapeutic intervention in order to improve the following deficits and impairments:  Abnormal gait, Decreased activity tolerance, Decreased coordination, Decreased mobility, Decreased range of motion, Difficulty walking, Decreased strength, Hypomobility, Increased fascial restricitons, Impaired flexibility, Impaired tone, Postural dysfunction, Pain, Improper body mechanics, Impaired perceived functional ability, Impaired UE functional use  Visit Diagnosis: Pain in thoracic spine  Abnormal posture     Problem List Patient Active Problem List   Diagnosis Date Noted  . Evaluation by psychiatric service required 03/22/2019  . 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 Rib pain (bilteral, no trauma, inciting event) 01/18/2019  . Cervical spondylosis 01/18/2019  . Cervical radiculopathy at C7 01/18/2019  . Chronic pain syndrome  01/18/2019  . Background diabetic retinopathy associated with diabetes mellitus due to underlying condition ([place]) 01/17/2019  . H/O hiatal hernia 07/11/2015  . Diabetes mellitus type 2, uncomplicated ([place]) 60/11/5995  . Hyperlipidemia 07/23/2014   Durwin Reges DPT Durwin Reges 04/21/2020, 11:58 AM  [facility] [address], [place], [address] Phone: [phone]   Fax:  [phone]  Name: Luke Russo MRN: [id number] Date of Birth: [birth year]

## 2020-04-24 ENCOUNTER — Other Ambulatory Visit: Payer: Self-pay

## 2020-04-24 ENCOUNTER — Encounter: Payer: Self-pay | Admitting: Physical Therapy

## 2020-04-24 ENCOUNTER — Ambulatory Visit: Payer: BC Managed Care – PPO | Admitting: Physical Therapy

## 2020-04-24 DIAGNOSIS — M546 Pain in thoracic spine: Secondary | ICD-10-CM

## 2020-04-24 DIAGNOSIS — R293 Abnormal posture: Secondary | ICD-10-CM

## 2020-04-24 NOTE — Therapy (Signed)
[facility] [address], [place], [address] Phone: [phone]   Fax:  [phone]  Physical Therapy Treatment  Patient Details  Name: Luke Russo MRN: [id number] Date of Birth: 23-Sep-1965 No data recorded  Encounter Date: 04/24/2020   PT End of Session - 04/24/20 1038    Visit Number 9    Number of Visits 17    Date for PT Re-Evaluation 05/19/20    PT Start Time 1034    PT Stop Time 1113    PT Time Calculation (min) 39 min    Activity Tolerance Patient tolerated treatment well    Behavior During Therapy [facility] for tasks assessed/performed           Past Medical History:  Diagnosis Date  . Diabetes mellitus without complication ([place])   . History of hiatal hernia   . Hyperlipidemia     Past Surgical History:  Procedure Laterality Date  . APPENDECTOMY      There were no vitals filed for this visit.   Subjective Assessment - 04/24/20 1037    Subjective Patinet reports he just woke up, so he does not know how he is feeling just yet. L>R sided rib pain 3/10 without thoracic pain.    Pertinent History Pt is a 54 year old male presenting with mid-back pain >2 years, reports he thinks it started after running over a 2x4 with a forklift. Patient reports that he has a disc buldge that he is having a chiropractor "pop into place". Reports he has dull pain constantly in ribs and midback, but it will be tight/cramping with pushing/pulling, lifting, prolonged walking >53mins walking dog. He reports that he gets cramping sensation in the ribs when he is sitting and reaching forward toward dogs. Endorses that he had an unknown sickness prior to this starting and no one has been able to tell him what was wrong, but that he was hospitalized and lost a lot of weight. 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 He does not want to do physical activity for work or recreation becuase his neurologist advised him against it. He is ind with ADLs, but does not vacuum, wash the car, or complete yard work d/t staying away from pain per neurologist recommendation. Reports he has not been working for the past 2 years d/t trying to "build himself back up physically". Reports worst pain 5/10 best 2/10. Pt denies N/V, B&B changes, unexplained weight fluctuation, saddle paresthesia, fever, night sweats, or unrelenting night pain at this time.    Limitations Lifting;Walking;House hold activities    How long can you sit comfortably? 1 hour    How long can you stand comfortably? 1 hour    How long can you walk comfortably? 32mins    Diagnostic tests 12/12/19 Mild disc bulging at T2-3 and T7-8, with minimal cord flatteningat the T7-8 level. No significant spinal stenosis. Otherwise negative MRI of the thoracic spine. No other significant disc pathology, stenosis, or evidence for neural impingement.    Patient Stated Goals Decrease pain    Pain Onset 1 to 4 weeks ago           Ther-Ex Nustep L3 seat 8 UE 10 28min for gentle strengthening + mobility Supine BUE AAROM flex with PVC pipe full foam under thoracic spine  for increased mobilityx15; cuing for breath control with good carry over Cable pull through 20# 3x 10 with good carry over following demo and min cuing for neutral spine and breath control with good carry over Lat pulldown 35# with focus on eccentric control with good carry over Band pull aparts, scap retraction BluTB 3x 10 with min cuing initially for posture with good carry over Theraball rollouts sitting x12 3sec hold with cuing for breath control with good carry over; 12x L <> R  Manual T6-T96x30 boutseach segmentG3CPAfor increased mobility; Sidlying L rib mobilization with pillows under trunk and UE overhead STM to bilat tspine  paraspinals(R>L)                          PT Education - 04/24/20 1038    Education Details therex form/technique    Person(s) Educated Patient    Methods Explanation;Demonstration;Verbal cues    Comprehension Verbalized understanding;Returned demonstration;Verbal cues required            PT Short Term Goals - 03/25/20 1322      PT SHORT TERM GOAL #1   Title Pt will be independent with HEP in order to improve strength and decrease back pain in order to improve pain-free function at home and work.    Baseline 03/25/20 HEP given    Time 4    Period Weeks    Status New             PT Long Term Goals - 03/25/20 1326      PT LONG TERM GOAL #1   Title Patient will increase FOTO score to 55 to demonstrate predicted increase in functional mobility to complete ADLs    Baseline 03/24/20 45    Time 8    Period Weeks    Status New      PT LONG TERM GOAL #2   Title Pt will decrease worst back pain as reported on NPRS by at least 2 points in order to demonstrate clinically significant reduction in back pain.    Baseline 03/25/20 5/10    Time 8    Period Weeks    Status New      PT LONG TERM GOAL #3   Title Patient will increase periscapular strength to at least 4+/5 bilat to demonstrate strength needed for heavy ADLs, dog walking, and for postural restoration    Baseline 03/25/20 R/L Y 3/4- T 3+/4- T 3+/4-    Time 8    Period Weeks    Status New                 Plan - 04/24/20 1047    Clinical Impression Statement PT continued to utilize manual techniques for pain reduction and progression of spine ext and periscapular strength with success. Patient is able to comply with cuing for technique of therex and breath control with good motivation throughout session. PT will continue progression as able.    Personal Factors and Comorbidities Age;Comorbidity 1;Comorbidity 2;Fitness;Past/Current Experience;Time since onset of  injury/illness/exacerbation;Profession    Comorbidities DM2, HL    Examination-Activity Limitations Lift;Carry;Stand;Caring for Others    Examination-Participation Restrictions Community Activity;Cleaning;Church    Stability/Clinical Decision Making Evolving/Moderate complexity    Clinical Decision Making Moderate    Rehab Potential Good    PT Frequency 2x / week    PT Duration 8 weeks    PT Treatment/Interventions ADLs/Self Care Home Management;Electrical Stimulation;Moist Heat;Traction;Iontophoresis 4mg /ml Dexamethasone;Ultrasound;Functional mobility training;Therapeutic activities;Therapeutic exercise;Neuromuscular re-education;Patient/family education;Manual techniques;Scar  mobilization;Passive range of motion;Dry needling;Spinal Manipulations;Joint Manipulations;Taping    PT Next Visit Plan breath control, increased thoracic ext, pain science    PT Home Exercise Plan Thoracic ext, cat-camel, doorway pec stretch    Consulted and Agree with Plan of Care Patient           Patient will benefit from skilled therapeutic intervention in order to improve the following deficits and impairments:  Abnormal gait, Decreased activity tolerance, Decreased coordination, Decreased mobility, Decreased range of motion, Difficulty walking, Decreased strength, Hypomobility, Increased fascial restricitons, Impaired flexibility, Impaired tone, Postural dysfunction, Pain, Improper body mechanics, Impaired perceived functional ability, Impaired UE functional use  Visit Diagnosis: Pain in thoracic spine  Abnormal posture     Problem List Patient Active Problem List   Diagnosis Date Noted  . Evaluation by psychiatric service required 03/22/2019  . 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 Rib pain (bilteral, no trauma, inciting event) 01/18/2019  . Cervical spondylosis 01/18/2019  . Cervical radiculopathy at C7 01/18/2019  . Chronic pain syndrome 01/18/2019  . Background diabetic retinopathy associated with  diabetes mellitus due to underlying condition ([place]) 01/17/2019  . H/O hiatal hernia 07/11/2015  . Diabetes mellitus type 2, uncomplicated ([place]) 16/55/3748  . Hyperlipidemia 07/23/2014   Durwin Reges DPT Durwin Reges 04/24/2020, 11:08 AM  [facility] [address] [place], [address] Phone: [phone]   Fax:  [phone]  Name: Luke Russo MRN: [id number] Date of Birth: 02-15-1966

## 2020-04-29 ENCOUNTER — Encounter: Payer: Self-pay | Admitting: Physical Therapy

## 2020-04-29 ENCOUNTER — Other Ambulatory Visit: Payer: Self-pay

## 2020-04-29 ENCOUNTER — Ambulatory Visit: Payer: BC Managed Care – PPO | Admitting: Physical Therapy

## 2020-04-29 DIAGNOSIS — M546 Pain in thoracic spine: Secondary | ICD-10-CM | POA: Diagnosis not present

## 2020-04-29 DIAGNOSIS — R293 Abnormal posture: Secondary | ICD-10-CM

## 2020-04-29 NOTE — Therapy (Signed)
[facility] [address] [place], [address] Phone: [phone]   Fax:  [phone]  Physical Therapy Treatment  Patient Details  Name: Luke Russo MRN: [id number] Date of Birth: 11-02-1965 No data recorded  Encounter Date: 04/29/2020   PT End of Session - 04/29/20 1039    Visit Number 10    Number of Visits 17    Date for PT Re-Evaluation 05/19/20    PT Start Time 1034    PT Stop Time 1112    PT Time Calculation (min) 38 min    Activity Tolerance Patient tolerated treatment well    Behavior During Therapy [facility] for tasks assessed/performed           Past Medical History:  Diagnosis Date   Diabetes mellitus without complication ([place])    History of hiatal hernia    Hyperlipidemia     Past Surgical History:  Procedure Laterality Date   APPENDECTOMY      There were no vitals filed for this visit.   Subjective Assessment - 04/29/20 1037    Subjective Patient reports continued decreased thoracic pain, 2/10 lateral rib pain L>R, 1/10 thoracic pain. Reports compliance with HEP. Patient reports he feels 50% better overall, reports mostly limited by tension at bilat lateral ribs that is exacerbated by lifting 30#, walking large dog because she pulls, and that prolonged sitting irritates pain; reports he is ind without increased pain with iADLs and housechores. He reports that he thinks manual techniques are most helpful for his pain.    Pertinent History Pt is a 54 year old male presenting with mid-back pain >2 years, reports he thinks it started after running over a 2x4 with a forklift. Patient reports that he has a disc buldge that he is having a chiropractor "pop into place". Reports he has dull pain constantly in ribs and midback, but it will be tight/cramping with pushing/pulling, lifting, prolonged walking >45mins walking dog. He reports that he gets cramping sensation in the ribs when he is sitting  and reaching forward toward dogs. Endorses that he had an unknown sickness prior to this starting and no one has been able to tell him what was wrong, but that he was hospitalized and lost a lot of weight. 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 He does not want to do physical activity for work or recreation becuase his neurologist advised him against it. He is ind with ADLs, but does not vacuum, wash the car, or complete yard work d/t staying away from pain per neurologist recommendation. Reports he has not been working for the past 2 years d/t trying to "build himself back up physically". Reports worst pain 5/10 best 2/10. Pt denies N/V, B&B changes, unexplained weight fluctuation, saddle paresthesia, fever, night sweats, or unrelenting night pain at this time.    Limitations Lifting;Walking;House hold activities    How long can you sit comfortably? 1 hour    How long can you stand comfortably? 1 hour    How long can you walk comfortably? 22mins    Diagnostic tests 12/12/19 Mild disc bulging at T2-3 and T7-8, with minimal cord flatteningat the T7-8 level. No significant spinal stenosis. Otherwise negative MRI of the thoracic spine. No other significant disc pathology, stenosis, or evidence for neural impingement.  Patient Stated Goals Decrease pain    Pain Onset 1 to 4 weeks ago           Ther-Ex Nustep L3 seat 8 UE 10 29min for gentle strengthening + mobility Supine BUE AAROM flex with PVC pipefullfoam under thoracic spine for increased mobilityx15; encouragement  Prone off mat thoracic ext 2x 10 with min cuing for scap retraction  Lat stretch kneeling with elbows elevated 60sec hold Lateral childs pose 30sec hold  HEP update with education on all parameters; verbalized understanding: Access Code: [email or web address] Prone Upper Back Extension Off Table with  Hands Behind Head - 1 x daily - 2-3 x weekly - 3 sets - 10 reps Seated Shoulder Horizontal Abduction with Resistance - Thumbs Up - 1 x daily - 2-3 x weekly - 3 sets - 10 reps Prone Chest Stretch on Chair - 3 x daily - 7 x weekly - 60sec hold Doorway Pec Stretch at 120 Degrees Abduction - 3 x daily - 7 x weekly - 60 hold Cat-Camel - 2 x daily - 7 x weekly - 15 reps - 2sec hold Seated Thoracic Lumbar Extension - 2 x daily - 7 x weekly - 15 reps - 3-5sec hold   Manual T6-T96x30 boutseach [id number] increased mobility; [place] R rib mobilization with pillows under trunk and UE overhead STM to bilat tspine paraspinals(R>L)         PT Education - 04/29/20 1038    Education Details therex form/technique, reassessment    Person(s) Educated Patient    Methods Explanation;Demonstration;Verbal cues    Comprehension Verbalized understanding;Returned demonstration;Verbal cues required            PT Short Term Goals - 03/25/20 1322      PT SHORT TERM GOAL #1   Title Pt will be independent with HEP in order to improve strength and decrease back pain in order to improve pain-free function at home and work.    Baseline 03/25/20 HEP given    Time 4    Period Weeks    Status New             PT Long Term Goals - 04/29/20 1040      PT LONG TERM GOAL #1   Title Patient will increase FOTO score to 55 to demonstrate predicted increase in functional mobility to complete ADLs    Baseline 03/24/20 45; 04/29/20 67    Time 8    Period Weeks    Status Achieved      PT LONG TERM GOAL #2   Title Pt will decrease worst back pain as reported on NPRS by at least 2 points in order to demonstrate clinically significant reduction in back pain.    Baseline 03/25/20 5/10; 04/29/20 2/10 occassionally 3/10    Time 8    Period Weeks    Status Achieved      PT LONG TERM GOAL #3   Title Patient will increase periscapular strength to at least 4+/5 bilat to demonstrate strength needed for  heavy ADLs, dog walking, and for postural restoration    Baseline 03/25/20 R/L Y 3/4- T 3+/4- T 3+/4- ; 04/29/20 R/L Y 4+/4+ T 5/5    Time 8    Period Weeks    Status Achieved      PT LONG TERM GOAL #4   Title Patient will be able to lift 30# box with proper mechanics and pain not exceeding 3/10 in order to complete heavy household tasks    Baseline 04/29/20 does  not attempt 30# lift in order to prevent pain    Time 4    Period Weeks    Status New                 Plan - 04/29/20 1131    Clinical Impression Statement PT reassessed goals this session, where patient has meet current goals, reports he is still unable to lift over 30lbs or walk his dog (d/t her pulling) without severe pain, avoids these activities. PT updated HEP to reflect self-pain management techniques to attempt to increase independence for 1x/week frequency. PT will continue progression toward functional lifting and strenghtening, with manual techniques as needed for pain reduction.    Personal Factors and Comorbidities Age;Comorbidity 1;Comorbidity 2;Fitness;Past/Current Experience;Time since onset of injury/illness/exacerbation;Profession    Comorbidities DM2, HL    Examination-Activity Limitations Lift;Carry;Stand;Caring for Others    Examination-Participation Restrictions Community Activity;Cleaning;Church    Stability/Clinical Decision Making Evolving/Moderate complexity    Clinical Decision Making Moderate    Rehab Potential Good    PT Frequency 2x / week    PT Duration 8 weeks    PT Treatment/Interventions ADLs/Self Care Home Management;Electrical Stimulation;Moist Heat;Traction;Iontophoresis 4mg /ml Dexamethasone;Ultrasound;Functional mobility training;Therapeutic activities;Therapeutic exercise;Neuromuscular re-education;Patient/family education;Manual techniques;Scar mobilization;Passive range of motion;Dry needling;Spinal Manipulations;Joint Manipulations;Taping    PT Next Visit Plan breath control, increased  thoracic ext, pain science    PT Home Exercise Plan Thoracic ext, cat-camel, doorway pec stretch    Consulted and Agree with Plan of Care Patient           Patient will benefit from skilled therapeutic intervention in order to improve the following deficits and impairments:  Abnormal gait, Decreased activity tolerance, Decreased coordination, Decreased mobility, Decreased range of motion, Difficulty walking, Decreased strength, Hypomobility, Increased fascial restricitons, Impaired flexibility, Impaired tone, Postural dysfunction, Pain, Improper body mechanics, Impaired perceived functional ability, Impaired UE functional use  Visit Diagnosis: Pain in thoracic spine  Abnormal posture     Problem List Patient Active Problem List   Diagnosis Date Noted   Evaluation by psychiatric service required 03/22/2019   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 [place], [address] Phone: [phone]   Fax:  [phone]  Name: Luke Russo MRN: [id number] Date of Birth: [birth year]

## 2020-05-01 ENCOUNTER — Ambulatory Visit: Payer: BC Managed Care – PPO | Admitting: Physical Therapy

## 2020-05-06 ENCOUNTER — Ambulatory Visit: Payer: BC Managed Care – PPO

## 2020-05-06 ENCOUNTER — Other Ambulatory Visit: Payer: Self-pay

## 2020-05-06 DIAGNOSIS — R293 Abnormal posture: Secondary | ICD-10-CM

## 2020-05-06 DIAGNOSIS — M546 Pain in thoracic spine: Secondary | ICD-10-CM | POA: Diagnosis not present

## 2020-05-06 NOTE — Therapy (Signed)
[facility] [address], [place], [address] Phone: [phone]   Fax:  [phone]  Physical Therapy Treatment  Patient Details  Name: Luke Russo MRN: [id number] Date of Birth: [birth year] No data recorded  Encounter Date: 05/06/2020   PT End of Session - 05/06/20 1118    Visit Number 11    Number of Visits 17    Date for PT Re-Evaluation 05/19/20    PT Start Time 1114    PT Stop Time 1154    PT Time Calculation (min) 40 min    Activity Tolerance Patient tolerated treatment well;No increased pain    Behavior During Therapy WFL for tasks assessed/performed           Past Medical History:  Diagnosis Date  . Diabetes mellitus without complication ([place])   . History of hiatal hernia   . Hyperlipidemia     Past Surgical History:  Procedure Laterality Date  . APPENDECTOMY      There were no vitals filed for this visit.   Subjective Assessment - 05/06/20 1117    Subjective Pt doing well this date, no updates since last visit. Pt reports pain in ribs is minimal today.    Pertinent History Pt is a 54 year old male presenting with mid-back pain >2 years, reports he thinks it started after running over a 2x4 with a forklift. Patient reports that he has a disc buldge that he is having a chiropractor "pop into place". Reports he has dull pain constantly in ribs and midback, but it will be tight/cramping with pushing/pulling, lifting, prolonged walking >66mins walking dog. He reports that he gets cramping sensation in the ribs when he is sitting and reaching forward toward dogs. Endorses that he had an unknown sickness prior to this starting and no one has been able to tell him what was wrong, but that he was hospitalized and lost a lot of weight. 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 He does not want to do physical activity for work or recreation becuase his neurologist advised him against it. He is ind with ADLs, but does not vacuum, wash the car, or complete yard work d/t staying away from pain per neurologist recommendation. Reports he has not been working for the past 2 years d/t trying to "build himself back up physically". Reports worst pain 5/10 best 2/10. Pt denies N/V, B&B changes, unexplained weight fluctuation, saddle paresthesia, fever, night sweats, or unrelenting night pain at this time.    Currently in Pain? Yes    Pain Score 2     Pain Location Rib cage           Ther-Ex -Nustep L3 seat 8 UE 10 50min for scapulothoracic AA/ROM, minA required for correct bpody positioning on Nustep (of legs, arms, and trunk) with postural deviation from intended setup  -Lat stretch kneeling (prayer) with elbows elevated 5x30sec hold -Supine BUE AA/ROM wand flexion with PVC pipe full foam under thoracic spine for increased mobility; *1x15 at xiphoid level, 1x15 at nipple line   -book openings 1x15 bilat   -quadruped UE reach 1x10 bilat (excellent scapulothoracic rhythm)  -seated cable row 2x12 @ 25lb -quadruped LE reach 1x10 bilat,  -front load squat c lat bar chained to floor cable (seated c back to machine)  1x10        PT Short Term Goals - 03/25/20 1322      PT SHORT TERM GOAL #1   Title Pt will be independent with HEP in order to improve strength and decrease back pain in order to improve pain-free function at home and work.    Baseline 03/25/20 HEP given    Time 4    Period Weeks    Status New             PT Long Term Goals - 04/29/20 1040      PT LONG TERM GOAL #1   Title Patient will increase FOTO score to 55 to demonstrate predicted increase in functional mobility to complete ADLs    Baseline 03/24/20 45; 04/29/20 67    Time 8    Period Weeks    Status Achieved      PT LONG TERM GOAL #2   Title Pt will decrease worst back  pain as reported on NPRS by at least 2 points in order to demonstrate clinically significant reduction in back pain.    Baseline 03/25/20 5/10; 04/29/20 2/10 occassionally 3/10    Time 8    Period Weeks    Status Achieved      PT LONG TERM GOAL #3   Title Patient will increase periscapular strength to at least 4+/5 bilat to demonstrate strength needed for heavy ADLs, dog walking, and for postural restoration    Baseline 03/25/20 R/L Y 3/4- T 3+/4- T 3+/4- ; 04/29/20 R/L Y 4+/4+ T 5/5    Time 8    Period Weeks    Status Achieved      PT LONG TERM GOAL #4   Title Patient will be able to lift 30# box with proper mechanics and pain not exceeding 3/10 in order to complete heavy household tasks    Baseline 04/29/20 does not attempt 30# lift in order to prevent pain    Time 4    Period Weeks    Status New                 Plan - 05/06/20 1118    Clinical Impression Statement Continued with current POC, heavy emphasis on thoracic extension directional preference in attempts to reduce/centralize pain. Pt conitnues to make steady progress overall. All interventions performed without exacerbation of pain or fatigue. Introduced quadruped and cable exercises this date with good response in general, but it seems to aggravate a prior left shoulder issue.    Personal Factors and Comorbidities Age;Comorbidity 1;Comorbidity 2;Fitness;Past/Current Experience;Time since onset of injury/illness/exacerbation;Profession    Comorbidities DM2, HL    Examination-Activity Limitations Lift;Carry;Stand;Caring for Others    Examination-Participation Restrictions Community Activity;Cleaning;Church    Stability/Clinical Decision Making Evolving/Moderate complexity    Clinical Decision Making Moderate    Rehab Potential Good    PT Frequency 2x / week    PT Duration 8 weeks    PT Treatment/Interventions ADLs/Self Care Home Management;Electrical Stimulation;Moist Heat;Traction;Iontophoresis 4mg /ml  Dexamethasone;Ultrasound;Functional mobility training;Therapeutic activities;Therapeutic exercise;Neuromuscular re-education;Patient/family education;Manual techniques;Scar mobilization;Passive range of motion;Dry needling;Spinal Manipulations;Joint Manipulations;Taping    PT Next Visit Plan breath control, increased thoracic ext, pain science    PT Home Exercise Plan Thoracic ext, cat-camel, doorway pec stretch    Consulted and Agree with Plan of Care Patient           Patient will benefit from skilled therapeutic intervention in order to improve the following deficits and impairments:  Abnormal gait, Decreased activity tolerance, Decreased coordination, Decreased  mobility, Decreased range of motion, Difficulty walking, Decreased strength, Hypomobility, Increased fascial restricitons, Impaired flexibility, Impaired tone, Postural dysfunction, Pain, Improper body mechanics, Impaired perceived functional ability, Impaired UE functional use  Visit Diagnosis: Pain in thoracic spine  Abnormal posture     Problem List Patient Active Problem List   Diagnosis Date Noted  . Evaluation by psychiatric service required 03/22/2019  . 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 Rib pain (bilteral, no trauma, inciting event) 01/18/2019  . Cervical spondylosis 01/18/2019  . Cervical radiculopathy at C7 01/18/2019  . Chronic pain syndrome 01/18/2019  . Background diabetic retinopathy associated with diabetes mellitus due to underlying condition ([place]) 01/17/2019  . H/O hiatal hernia 07/11/2015  . Diabetes mellitus type 2, uncomplicated ([place]) 49/67/5916  . Hyperlipidemia 07/23/2014   11:32 AM, 05/06/20 Etta Grandchild, PT, DPT Physical Therapist - [place] [phone] (Office)   Kaye Mitro C 05/06/2020, 11:21 AM  [facility] [address] [place], [address] Phone: [phone]   Fax:  [phone]  Name: Luke Russo MRN: [id number] Date of Birth: June 16, 1966

## 2020-05-08 ENCOUNTER — Ambulatory Visit: Payer: BC Managed Care – PPO | Admitting: Physical Therapy

## 2020-05-14 ENCOUNTER — Encounter (INDEPENDENT_AMBULATORY_CARE_PROVIDER_SITE_OTHER): Payer: BC Managed Care – PPO | Admitting: Ophthalmology

## 2020-05-15 ENCOUNTER — Other Ambulatory Visit: Payer: Self-pay

## 2020-05-15 ENCOUNTER — Ambulatory Visit: Payer: BC Managed Care – PPO | Attending: Neurology | Admitting: Physical Therapy

## 2020-05-15 ENCOUNTER — Encounter: Payer: Self-pay | Admitting: Physical Therapy

## 2020-05-15 DIAGNOSIS — M546 Pain in thoracic spine: Secondary | ICD-10-CM | POA: Insufficient documentation

## 2020-05-15 DIAGNOSIS — R293 Abnormal posture: Secondary | ICD-10-CM | POA: Insufficient documentation

## 2020-05-15 NOTE — Therapy (Signed)
[facility] [address], [place], [address] Phone: [phone]   Fax:  [phone]  Physical Therapy Treatment  Patient Details  Name: Luke Russo MRN: [id number] Date of Birth: 03/06/1966 No data recorded  Encounter Date: 05/15/2020    Past Medical History:  Diagnosis Date  . Diabetes mellitus without complication ([place])   . History of hiatal hernia   . Hyperlipidemia     Past Surgical History:  Procedure Laterality Date  . APPENDECTOMY      There were no vitals filed for this visit.    Ther-Ex -Nustep L3seat 8 UE 10 72min for scapulothoracic AA/ROM, minA required for correct bpody positioning on Nustep (of legs, arms, and trunk) with postural deviation from intended setup - 30# box lift and carry 38ft with good carry over of all previous lifting technique recommendations PT reviewed the following HEP with patient with patient able to demonstrate a set of the following with min cuing for correction needed. PT educated patient on parameters of therex (how/when to inc/decrease intensity, frequency, rep/set range, stretch hold time, and purpose of therex) with verbalized understanding.  Access Code: [email or web address] Seated Thoracic Lumbar Extension - 1 x daily - 7 x weekly - 2 sets - 12-15 reps Cat-Camel - 1 x daily - 7 x weekly - 2 sets - 12-15 reps Doorway Pec Stretch at 90 Degrees Abduction - 3 x daily - 7 x weekly - 30-60 hold Prone Chest Stretch on Chair - 3 x daily - 7 x weekly - 10 reps - 30-60sec hold Standing Bent Over Shoulder Row with Resistance - 1 x daily - 2-3 x weekly - 3 sets - 6-10 reps Standing High Row with Resistance - 1 x daily - 2-3 x weekly - 3 sets - 6-10 reps            PT Short Term Goals - 03/25/20 1322      PT SHORT TERM GOAL #1   Title Pt will be independent with HEP in order to improve strength and decrease back pain in order to improve pain-free function at home and  work.    Baseline 03/25/20 HEP given    Time 4    Period Weeks    Status New             PT Long Term Goals - 04/29/20 1040      PT LONG TERM GOAL #1   Title Patient will increase FOTO score to 55 to demonstrate predicted increase in functional mobility to complete ADLs    Baseline 03/24/20 45; 04/29/20 67    Time 8    Period Weeks    Status Achieved      PT LONG TERM GOAL #2   Title Pt will decrease worst back pain as reported on NPRS by at least 2 points in order to demonstrate clinically significant reduction in back pain.    Baseline 03/25/20 5/10; 04/29/20 2/10 occassionally 3/10    Time 8    Period Weeks    Status Achieved      PT LONG TERM GOAL #3   Title Patient will increase periscapular strength to at least 4+/5 bilat to demonstrate strength needed for heavy ADLs, dog walking, and for postural restoration    Baseline 03/25/20 R/L Y 3/4- T 3+/4- T 3+/4- ; 04/29/20 R/L Y 4+/4+ T 5/5    Time 8    Period Weeks    Status Achieved  PT LONG TERM GOAL #4   Title Patient will be able to lift 30# box with proper mechanics and pain not exceeding 3/10 in order to complete heavy household tasks    Baseline 04/29/20 does not attempt 30# lift in order to prevent pain    Time 4    Period Weeks    Status New                  Patient will benefit from skilled therapeutic intervention in order to improve the following deficits and impairments:     Visit Diagnosis: No diagnosis found.     Problem List Patient Active Problem List   Diagnosis Date Noted  . Evaluation by psychiatric service required 03/22/2019  . 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 Rib pain (bilteral, no trauma, inciting event) 01/18/2019  . Cervical spondylosis 01/18/2019  . Cervical radiculopathy at C7 01/18/2019  . Chronic pain syndrome 01/18/2019  . Background diabetic retinopathy associated with diabetes mellitus due to underlying condition ([place]) 01/17/2019  . H/O hiatal hernia 07/11/2015  .  Diabetes mellitus type 2, uncomplicated ([place]) 03/79/4446  . Hyperlipidemia 07/23/2014   Durwin Reges DPT Durwin Reges 05/15/2020, 10:43 AM  [facility] [address] [place], [address] Phone: [phone]   Fax:  [phone]  Name: Luke Russo MRN: [id number] Date of Birth: Nov 12, 1965

## 2020-05-27 ENCOUNTER — Encounter (INDEPENDENT_AMBULATORY_CARE_PROVIDER_SITE_OTHER): Payer: BC Managed Care – PPO | Admitting: Ophthalmology

## 2020-05-28 ENCOUNTER — Ambulatory Visit: Payer: BC Managed Care – PPO | Admitting: Physical Therapy

## 2020-06-04 ENCOUNTER — Encounter (INDEPENDENT_AMBULATORY_CARE_PROVIDER_SITE_OTHER): Payer: BC Managed Care – PPO | Admitting: Ophthalmology

## 2020-06-09 ENCOUNTER — Encounter: Payer: Self-pay | Admitting: Physical Therapy

## 2020-06-09 ENCOUNTER — Other Ambulatory Visit: Payer: Self-pay

## 2020-06-09 ENCOUNTER — Ambulatory Visit: Payer: BC Managed Care – PPO | Attending: Neurology | Admitting: Physical Therapy

## 2020-06-09 DIAGNOSIS — R293 Abnormal posture: Secondary | ICD-10-CM | POA: Diagnosis present

## 2020-06-09 DIAGNOSIS — M546 Pain in thoracic spine: Secondary | ICD-10-CM | POA: Diagnosis not present

## 2020-06-09 NOTE — Therapy (Signed)
[facility] [address], [place], [address] Phone: [phone]   Fax:  [phone]  Physical Therapy Treatment  Patient Details  Name: Luke Russo MRN: [id number] Date of Birth: 04-27-1966 No data recorded  Encounter Date: 06/09/2020   PT End of Session - 06/09/20 1356    Visit Number 13    Number of Visits 17    Date for PT Re-Evaluation 05/19/20    PT Start Time 0150    PT Stop Time 0230    PT Time Calculation (min) 40 min    Activity Tolerance Patient tolerated treatment well;No increased pain    Behavior During Therapy WFL for tasks assessed/performed           Past Medical History:  Diagnosis Date  . Diabetes mellitus without complication ([place])   . History of hiatal hernia   . Hyperlipidemia     Past Surgical History:  Procedure Laterality Date  . APPENDECTOMY      There were no vitals filed for this visit.   Subjective Assessment - 06/09/20 1353    Subjective Patient reports he is completing HEP. Has minimal back/rib pain, only when "he does a lot and they start cramping bad", after doing yard work or sitting for a long time, but this pain subsides with stretching    Pertinent History Pt is a 54 year old male presenting with mid-back pain >2 years, reports he thinks it started after running over a 2x4 with a forklift. Patient reports that he has a disc buldge that he is having a chiropractor "pop into place". Reports he has dull pain constantly in ribs and midback, but it will be tight/cramping with pushing/pulling, lifting, prolonged walking >70mins walking dog. He reports that he gets cramping sensation in the ribs when he is sitting and reaching forward toward dogs. Endorses that he had an unknown sickness prior to this starting and no one has been able to tell him what was wrong, but that he was hospitalized and lost a lot of weight. 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 He does not want to do physical activity for work or recreation becuase his neurologist advised him against it. He is ind with ADLs, but does not vacuum, wash the car, or complete yard work d/t staying away from pain per neurologist recommendation. Reports he has not been working for the past 2 years d/t trying to "build himself back up physically". Reports worst pain 5/10 best 2/10. Pt denies N/V, B&B changes, unexplained weight fluctuation, saddle paresthesia, fever, night sweats, or unrelenting night pain at this time.    Limitations Lifting;Walking;House hold activities    How long can you sit comfortably? 1 hour    How long can you stand comfortably? 1 hour    How long can you walk comfortably? 78mins    Diagnostic tests 12/12/19 Mild disc bulging at T2-3 and T7-8, with minimal cord flatteningat the T7-8 level. No significant spinal stenosis. Otherwise negative MRI of the thoracic spine. No other significant disc pathology, stenosis, or evidence for neural impingement.    Patient Stated Goals Decrease pain    Pain Onset 1 to 4 weeks ago            Ther-Ex -Nustep L3seat 8 UE 10 91min for scapulothoracic AA/ROM,  minA required for correct bpody positioning on Nustep (of legs, arms, and trunk) with postural deviation from intended setup PT reviewed the following HEP with patient with patient able to demonstrate a set of the following with min cuing for correction needed. PT educated patient on parameters of therex (how/when to inc/decrease intensity, frequency, rep/set range, stretch hold time, and purpose of therex) with verbalized understanding.  Access Code: [email or web address] Seated Thoracic Lumbar Extension - 1 x daily - 7 x weekly - 2 sets - 12-15 reps Cat-Camel - 1 x daily - 7 x weekly - 2 sets - 12-15 reps Doorway Pec Stretch at 90 Degrees Abduction - 3 x daily - 7 x  weekly - 30-60 hold Prone Chest Stretch on Chair - 3 x daily - 7 x weekly - 10 reps - 30-60sec hold Standing Bent Over Shoulder Row with Resistance - 1 x daily - 2-3 x weekly - 3 sets - 6-10 reps Standing High Row with Resistance - 1 x daily - 2-3 x weekly - 3 sets - 6-10 reps Lateral childs pose 60sec hold                   PT Education - 06/09/20 1356    Education Details therex form/technique    Person(s) Educated Patient    Methods Explanation;Demonstration;Verbal cues    Comprehension Verbalized understanding;Returned demonstration;Verbal cues required            PT Short Term Goals - 06/09/20 1357      PT SHORT TERM GOAL #1   Title Pt will be independent with HEP in order to improve strength and decrease back pain in order to improve pain-free function at home and work.    Baseline 05/15/20 HEP update given; 06/09/20 completing HEP without question/concern    Time 4    Period Weeks    Status Achieved             PT Long Term Goals - 06/09/20 1357      PT LONG TERM GOAL #1   Title Patient will increase FOTO score to 55 to demonstrate predicted increase in functional mobility to complete ADLs    Baseline 03/24/20 45; 04/29/20 67    Time 8    Period Weeks    Status Achieved      PT LONG TERM GOAL #2   Title Pt will decrease worst back pain as reported on NPRS by at least 2 points in order to demonstrate clinically significant reduction in back pain.    Baseline 03/25/20 5/10; 04/29/20 2/10 occassionally 3/10; 05/15/20 between 1-2/10    Time 8    Period Weeks    Status Achieved      PT LONG TERM GOAL #3   Title Patient will increase periscapular strength to at least 4+/5 bilat to demonstrate strength needed for heavy ADLs, dog walking, and for postural restoration    Baseline 03/25/20 R/L Y 3/4- T 3+/4- T 3+/4- ; 04/29/20 R/L Y 4+/4+ T 5/5    Time 8    Period Weeks    Status Achieved      PT LONG TERM GOAL #4   Title Patient will be able to lift 30# box  with proper mechanics and pain not exceeding 3/10 in order to complete heavy household tasks    Baseline 04/29/20 does not attempt 30# lift in order to prevent pain; 05/15/20 able to lift and carry with good technique    Time 4    Period Weeks  Status Achieved                 Plan - 06/09/20 1401    Clinical Impression Statement PT reviewed HEP with patient with addition of lateral childs pose for stretching for "lateral cramps" with corrections needed for most of HEP, with good carry over/understanding from pt. Patient d/c to HEP for maintenance of PT gains. Pt given clinic contact info should any further questions or concerns arise.    Personal Factors and Comorbidities Age;Comorbidity 1;Comorbidity 2;Fitness;Past/Current Experience;Time since onset of injury/illness/exacerbation;Profession    Comorbidities DM2, HL    Examination-Activity Limitations Lift;Carry;Stand;Caring for Others    Examination-Participation Restrictions Community Activity;Cleaning;Church    Stability/Clinical Decision Making Evolving/Moderate complexity    Clinical Decision Making Moderate    Rehab Potential Good    PT Frequency 2x / week    PT Duration 8 weeks    PT Treatment/Interventions ADLs/Self Care Home Management;Electrical Stimulation;Moist Heat;Traction;Iontophoresis 4mg /ml Dexamethasone;Ultrasound;Functional mobility training;Therapeutic activities;Therapeutic exercise;Neuromuscular re-education;Patient/family education;Manual techniques;Scar mobilization;Passive range of motion;Dry needling;Spinal Manipulations;Joint Manipulations;Taping    PT Next Visit Plan breath control, increased thoracic ext, pain science    PT Home Exercise Plan [email or web address]    Consulted and Agree with Plan of Care Patient           Patient will benefit from skilled therapeutic intervention in order to improve the following deficits and impairments:  Abnormal gait, Decreased activity tolerance, Decreased coordination,  Decreased mobility, Decreased range of motion, Difficulty walking, Decreased strength, Hypomobility, Increased fascial restricitons, Impaired flexibility, Impaired tone, Postural dysfunction, Pain, Improper body mechanics, Impaired perceived functional ability, Impaired UE functional use  Visit Diagnosis: Pain in thoracic spine  Abnormal posture     Problem List Patient Active Problem List   Diagnosis Date Noted  . Evaluation by psychiatric service required 03/22/2019  . 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 Rib pain (bilteral, no trauma, inciting event) 01/18/2019  . Cervical spondylosis 01/18/2019  . Cervical radiculopathy at C7 01/18/2019  . Chronic pain syndrome 01/18/2019  . Background diabetic retinopathy associated with diabetes mellitus due to underlying condition ([place]) 01/17/2019  . H/O hiatal hernia 07/11/2015  . Diabetes mellitus type 2, uncomplicated ([place]) [id number]/8295  . Hyperlipidemia 07/23/2014   Durwin Reges DPT Durwin Reges 06/09/2020, 4:33 PM  [facility] [address], [place], [address] Phone: [phone]   Fax:  [phone]  Name: Luke Russo MRN: [id number] Date of Birth: February 07, 1966

## 2020-06-11 ENCOUNTER — Encounter (INDEPENDENT_AMBULATORY_CARE_PROVIDER_SITE_OTHER): Payer: BC Managed Care – PPO | Admitting: Ophthalmology

## 2020-06-13 ENCOUNTER — Encounter (INDEPENDENT_AMBULATORY_CARE_PROVIDER_SITE_OTHER): Payer: BC Managed Care – PPO | Admitting: Ophthalmology

## 2020-06-13 ENCOUNTER — Other Ambulatory Visit: Payer: Self-pay

## 2020-06-13 DIAGNOSIS — D3132 Benign neoplasm of left choroid: Secondary | ICD-10-CM

## 2020-06-13 DIAGNOSIS — H43813 Vitreous degeneration, bilateral: Secondary | ICD-10-CM | POA: Diagnosis not present

## 2020-06-13 DIAGNOSIS — E113313 Type 2 diabetes mellitus with moderate nonproliferative diabetic retinopathy with macular edema, bilateral: Secondary | ICD-10-CM | POA: Diagnosis not present

## 2020-06-13 DIAGNOSIS — E11311 Type 2 diabetes mellitus with unspecified diabetic retinopathy with macular edema: Secondary | ICD-10-CM | POA: Diagnosis not present

## 2020-07-11 ENCOUNTER — Encounter (INDEPENDENT_AMBULATORY_CARE_PROVIDER_SITE_OTHER): Payer: BC Managed Care – PPO | Admitting: Ophthalmology

## 2020-07-11 ENCOUNTER — Other Ambulatory Visit: Payer: Self-pay

## 2020-07-11 DIAGNOSIS — H43813 Vitreous degeneration, bilateral: Secondary | ICD-10-CM | POA: Diagnosis not present

## 2020-07-11 DIAGNOSIS — D3132 Benign neoplasm of left choroid: Secondary | ICD-10-CM | POA: Diagnosis not present

## 2020-07-11 DIAGNOSIS — E11311 Type 2 diabetes mellitus with unspecified diabetic retinopathy with macular edema: Secondary | ICD-10-CM

## 2020-07-11 DIAGNOSIS — E113513 Type 2 diabetes mellitus with proliferative diabetic retinopathy with macular edema, bilateral: Secondary | ICD-10-CM

## 2020-08-22 ENCOUNTER — Encounter (INDEPENDENT_AMBULATORY_CARE_PROVIDER_SITE_OTHER): Payer: BC Managed Care – PPO | Admitting: Ophthalmology

## 2020-09-09 ENCOUNTER — Encounter (INDEPENDENT_AMBULATORY_CARE_PROVIDER_SITE_OTHER): Payer: BC Managed Care – PPO | Admitting: Ophthalmology

## 2020-09-17 ENCOUNTER — Encounter (INDEPENDENT_AMBULATORY_CARE_PROVIDER_SITE_OTHER): Payer: BC Managed Care – PPO | Admitting: Ophthalmology

## 2020-09-17 ENCOUNTER — Other Ambulatory Visit: Payer: Self-pay

## 2020-09-17 DIAGNOSIS — H43813 Vitreous degeneration, bilateral: Secondary | ICD-10-CM

## 2020-09-17 DIAGNOSIS — E113512 Type 2 diabetes mellitus with proliferative diabetic retinopathy with macular edema, left eye: Secondary | ICD-10-CM | POA: Diagnosis not present

## 2020-09-17 DIAGNOSIS — E113311 Type 2 diabetes mellitus with moderate nonproliferative diabetic retinopathy with macular edema, right eye: Secondary | ICD-10-CM

## 2020-09-17 DIAGNOSIS — D3132 Benign neoplasm of left choroid: Secondary | ICD-10-CM

## 2020-11-17 ENCOUNTER — Encounter (INDEPENDENT_AMBULATORY_CARE_PROVIDER_SITE_OTHER): Payer: BC Managed Care – PPO | Admitting: Ophthalmology

## 2020-11-17 ENCOUNTER — Other Ambulatory Visit: Payer: Self-pay

## 2020-11-17 DIAGNOSIS — H43813 Vitreous degeneration, bilateral: Secondary | ICD-10-CM | POA: Diagnosis not present

## 2020-11-17 DIAGNOSIS — D3132 Benign neoplasm of left choroid: Secondary | ICD-10-CM | POA: Diagnosis not present

## 2020-11-17 DIAGNOSIS — E113512 Type 2 diabetes mellitus with proliferative diabetic retinopathy with macular edema, left eye: Secondary | ICD-10-CM

## 2020-11-17 DIAGNOSIS — E113311 Type 2 diabetes mellitus with moderate nonproliferative diabetic retinopathy with macular edema, right eye: Secondary | ICD-10-CM

## 2021-01-12 ENCOUNTER — Encounter (INDEPENDENT_AMBULATORY_CARE_PROVIDER_SITE_OTHER): Payer: BC Managed Care – PPO | Admitting: Ophthalmology

## 2021-09-28 ENCOUNTER — Other Ambulatory Visit: Payer: Self-pay | Admitting: Student

## 2021-09-28 DIAGNOSIS — M542 Cervicalgia: Secondary | ICD-10-CM

## 2021-10-01 ENCOUNTER — Other Ambulatory Visit: Payer: Self-pay

## 2021-10-01 ENCOUNTER — Ambulatory Visit
Admission: RE | Admit: 2021-10-01 | Discharge: 2021-10-01 | Disposition: A | Discharge status: Home / Self Care | Payer: BC Managed Care – PPO | Source: Ambulatory Visit | Attending: Student | Admitting: Student

## 2021-10-01 DIAGNOSIS — M542 Cervicalgia: Secondary | ICD-10-CM | POA: Insufficient documentation

## 2022-08-12 ENCOUNTER — Ambulatory Visit: Payer: BC Managed Care – PPO | Admitting: *Deleted

## 2022-09-03 ENCOUNTER — Ambulatory Visit: Payer: BC Managed Care – PPO | Admitting: *Deleted

## 2022-11-09 ENCOUNTER — Telehealth (HOSPITAL_COMMUNITY): Payer: Self-pay | Admitting: Cardiology

## 2022-11-09 NOTE — Telephone Encounter (Signed)
LVM w/Ivey, Pearl, pt is appropriate for [facility], refer pt to general cardiology

## 2022-12-01 ENCOUNTER — Ambulatory Visit: Payer: BC Managed Care – PPO | Admitting: Cardiovascular Disease

## 2022-12-01 NOTE — Progress Notes (Signed)
This encounter was created in error - please disregard.

## 2023-01-19 ENCOUNTER — Encounter: Payer: Self-pay | Admitting: Cardiovascular Disease

## 2023-01-19 ENCOUNTER — Ambulatory Visit: Payer: BC Managed Care – PPO | Attending: Cardiovascular Disease | Admitting: Cardiovascular Disease

## 2023-01-19 ENCOUNTER — Ambulatory Visit: Payer: BC Managed Care – PPO | Admitting: Cardiovascular Disease

## 2023-01-19 VITALS — BP 138/76 | HR 74 | Ht 68.0 in | Wt 179.2 lb

## 2023-01-19 DIAGNOSIS — R002 Palpitations: Secondary | ICD-10-CM

## 2023-01-19 MED ORDER — CARVEDILOL 6.25 MG PO TABS
6.2500 mg | ORAL_TABLET | Freq: Two times a day (BID) | ORAL | 3 refills | Status: AC
Start: 2023-01-19 — End: ?

## 2023-01-19 NOTE — Patient Instructions (Signed)
Medication Instructions:  START Coreg (Carvedilol) 6.25 mg by mouth twice daily  *If you need a refill on your cardiac medications before your next appointment, please call your pharmacy*   Lab Work: None  If you have labs (blood work) drawn today and your tests are completely normal, you will receive your results only by: MyChart Message (if you have MyChart) OR A paper copy in the mail If you have any lab test that is abnormal or we need to change your treatment, we will call you to review the results.   Testing/Procedures: None   Follow-Up: At [facility], you and your health needs are our priority.  As part of our continuing mission to provide you with exceptional heart care, we have created designated Provider Care Teams.  These Care Teams include your primary Cardiologist (physician) and Advanced Practice Providers (APPs -  Physician Assistants and Nurse Practitioners) who all work together to provide you with the care you need, when you need it.  We recommend signing up for the patient portal called "MyChart".  Sign up information is provided on this After Visit Summary.  MyChart is used to connect with patients for Virtual Visits (Telemedicine).  Patients are able to view lab/test results, encounter notes, upcoming appointments, etc.  Non-urgent messages can be sent to your provider as well.   To learn more about what you can do with MyChart, go to [email or web address].    Your next appointment:   4 month(s)  Provider:   Kristeen Miss, MD

## 2023-01-19 NOTE — Progress Notes (Signed)
Cardiology Office Note:    Date:  01/19/2023   ID:  Luke Russo, DOB Oct 25, 1965, MRN [id number]  PCP:  Luke Baseman, MD   [facility] Providers Cardiologist:  Luke Miss, MD     Referring MD: Luke Baseman, MD   Chief Complaint  Patient presents with   Palpitations     History of Present Illness:    Luke Russo is a 57 y.o. male with a hx of HLD, DM Has been having some heart flutters  Has been occurring for 6-7 month Palps are described as a sudden heart rate irregularity which makes him gasp for air.  The episode then resolves immediately after that The episodes are not associated with exercise.  They occur typically at night when he is lying down.  There is no associated shortness of breath syncope or presyncope.  Tries to exercise - walks the dogs frequently   Does not work  Is now retired   Has DM  - is poorly controlled  HBA1C is 11.7 ( [facility] )   No CP   Has some DOE if he overdoes it  Has a history of hyperlipidemia. Labs from [facility] in the [facility] reveal Triglyceride level of 365 HDL is 41 LDL is 72 Total cholesterol is 186.  We discussed reducing his intake of foods that are white, wheat, sweat   I suspect most of his shortness of breath with exertion is due to generalized deconditioning.  He really does not exercise at all.  I encouraged him to start walking 3 to 4 miles a day which I think will help.  If he has any episodes of chest pain he is to report back to [place].  His palpitations clinically sound like premature ventricular contractions.  He states that they are not all that severe.  We discussed having him wear a monitor but at this point I am not sure that it would show anything.  Will start him on low-dose carvedilol 6.25 mg twice a day and see if this helps suppress the PVCs.  The carvedilol also will be not as detrimental to his diabetic control.  I encouraged him to work on reducing his  carbohydrates and to start exercising.  He definitely needs to get his diabetes under better control.   Past Medical History:  Diagnosis Date   Diabetes mellitus without complication ([facility])    History of hiatal hernia    Hyperlipidemia     Past Surgical History:  Procedure Laterality Date   APPENDECTOMY      Current Medications: Current Meds  Medication Sig   ALPRAZolam (XANAX) 0.5 MG tablet Take 0.5 mg by mouth as needed.   carvedilol (COREG) 6.25 MG tablet Take 1 tablet (6.25 mg total) by mouth 2 (two) times daily.   Dulaglutide 1.5 MG/0.5ML SOPN Inject into the skin.   DULoxetine (CYMBALTA) 30 MG capsule Take by mouth.  Take 3 capsules (90 mg total) by mouth once daily   Ertugliflozin-metFORMIN HCl (SEGLUROMET) 7.5-500 MG TABS Take by mouth daily.   fluticasone (FLONASE) 50 MCG/ACT nasal spray instill 2 sprays into each nostril once daily   gabapentin (NEURONTIN) 800 MG tablet Take by mouth.  Take 1 tablet (800 mg total) by mouth 4 (four) times daily   gemfibrozil (LOPID) 600 MG tablet Take 600 mg by mouth 2 (two) times daily.   insulin glargine, 2 Unit Dial, (TOUJEO MAX SOLOSTAR) 300 UNIT/ML Solostar Pen Inject into the skin.  Inject 74  Units subcutaneously once daily   lovastatin (MEVACOR) 40 MG tablet Take 40 mg by mouth daily.   meloxicam (MOBIC) 15 MG tablet Take 15 mg by mouth as needed.   NOVOLOG FLEXPEN 100 UNIT/ML FlexPen Take 20-35 units three times daily as directed. Max daily dose is 100 units.   omeprazole (PRILOSEC) 20 MG capsule Take 20 mg by mouth 2 (two) times daily.   ondansetron (ZOFRAN) 4 MG tablet Take 4 mg by mouth as needed.   pantoprazole (PROTONIX) 20 MG tablet Take by mouth.  TAKE 1 TABLET(20 MG) BY MOUTH TWICE DAILY BEFORE MEALS   rosuvastatin (CRESTOR) 20 MG tablet Take by mouth.  Take 1 tablet (20 mg total) by mouth once daily   tiZANidine (ZANAFLEX) 4 MG tablet Take by mouth. Take 1 tablet (4 mg total) by mouth 3 (three) times daily as needed    vitamin B-12 (CYANOCOBALAMIN) 1000 MCG tablet Take by mouth.   Vitamin D, Ergocalciferol, (DRISDOL) 1.25 MG (50000 UNIT) CAPS capsule Take 50,000 Units by mouth once a week.   [DISCONTINUED] insulin aspart protamine - aspart (NOVOLOG MIX 70/30 FLEXPEN) (70-30) 100 UNIT/ML FlexPen Inject 15 units before breakfast meal. Inject 20 units before supper meal.   [DISCONTINUED] Insulin Glargine-Lixisenatide (SOLIQUA) 100-33 UNT-MCG/ML SOPN Inject 30 Units into the skin daily.     Allergies:   Patient has no known allergies.   Social History   Socioeconomic History   Marital status: Married    Spouse name: Luke Russo    Number of children: 3   Years of education: Not on file   Highest education level: Associate degree: occupational, Scientist, product/process development, or vocational program  Occupational History   Not on file  Tobacco Use   Smoking status: Never   Smokeless tobacco: Never  Vaping Use   Vaping Use: Never used  Substance and Sexual Activity   Alcohol use: No   Drug use: No   Sexual activity: Not Currently  Other Topics Concern   Not on file  Social History Narrative   Not on file   Social Determinants of Health   Financial Resource Strain: Medium Risk (03/22/2019)   Overall Financial Resource Strain (CARDIA)    Difficulty of Paying Living Expenses: Somewhat hard  Food Insecurity: Food Insecurity Present (03/22/2019)   Hunger Vital Sign    Worried About Running Out of Food in the Last Year: Often true    Ran Out of Food in the Last Year: Often true  Transportation Needs: No Transportation Needs (03/22/2019)   PRAPARE - Administrator, Civil Service (Medical): No    Lack of Transportation (Non-Medical): No  Physical Activity: Insufficiently Active (03/22/2019)   Exercise Vital Sign    Days of Exercise per Week: 3 days    Minutes of Exercise per Session: 30 min  Stress: Not on file  Social Connections: Unknown (03/22/2019)   Social Connection and Isolation Panel [NHANES]    Frequency of  Communication with Friends and Family: Not on file    Frequency of Social Gatherings with Friends and Family: Not on file    Attends Religious Services: Never    Database administrator or Organizations: Yes    Attends Banker Meetings: Never    Marital Status: Married     Family History: The patient's family history includes Breast cancer in his mother; Diabetes in his maternal grandfather, maternal grandmother, and mother; Heart disease in his father, maternal grandfather, maternal grandmother, and mother; Hypertension in his father; Lupus  in his sister; Thyroid disease in his mother.  ROS:   Please see the history of present illness.     All other systems reviewed and are negative.  EKGs/Labs/Other Studies Reviewed:    The following studies were reviewed today:   EKG:   January 19, 2023: Normal sinus rhythm at a rate of 79.  First-degree AV block.  Nonspecific ST and T wave abnormalities.  Recent Labs: No results found for requested labs within last 365 days.  Recent Lipid Panel    Component Value Date/Time   CHOL 204 (H) 11/08/2013 0359   TRIG 182 11/08/2013 0359   HDL 36 (L) 11/08/2013 0359   VLDL 36 11/08/2013 0359   LDLCALC 132 (H) 11/08/2013 0359     Risk Assessment/Calculations:                Physical Exam:    VS:  BP 138/76   Pulse 74   Ht 5\' 8"  (1.727 m)   Wt 179 lb 3.2 oz (81.3 kg)   SpO2 98%   BMI 27.25 kg/m     Wt Readings from Last 3 Encounters:  01/19/23 179 lb 3.2 oz (81.3 kg)  02/19/19 158 lb (71.7 kg)  08/30/17 139 lb (63 kg)     GEN:  Well nourished, well developed in no acute distress HEENT: Normal NECK: No JVD; No carotid bruits LYMPHATICS: No lymphadenopathy CARDIAC: RRR, no murmurs, rubs, gallops RESPIRATORY:  Clear to auscultation without rales, wheezing or rhonchi  ABDOMEN: Soft, non-tender, non-distended MUSCULOSKELETAL:  No edema; No deformity  SKIN: Warm and dry NEUROLOGIC:  Alert and oriented x 3 PSYCHIATRIC:   Normal affect   ASSESSMENT:    1. Palpitations    PLAN:    In order of problems listed above:  Palpitations:  His palpitations clinically sound like premature ventricular contractions.  He states that they are not all that severe.  We discussed having him wear a monitor but at this point I am not sure that it would show anything.  Will start him on low-dose carvedilol 6.25 mg twice a day and see if this helps suppress the PVCs.  The carvedilol also will be not as detrimental to his diabetic control.  2.  Dyspnea with exertion    I suspect most of his shortness of breath with exertion is due to generalized deconditioning.  He really does not exercise at all.  I encouraged him to start walking 3 to 4 miles a day which I think will help.  If he has any episodes of chest pain he is to report back to [place].    3. DM:   I encouraged him to work on reducing his carbohydrates and to start exercising.  We discussed reducing his intake of foods that are white, wheat, sweat   He definitely needs to get his diabetes under better control.          Medication Adjustments/Labs and Tests Ordered: Current medicines are reviewed at length with the patient today.  Concerns regarding medicines are outlined above.  Orders Placed This Encounter  Procedures   EKG 12-Lead   Meds ordered this encounter  Medications   carvedilol (COREG) 6.25 MG tablet    Sig: Take 1 tablet (6.25 mg total) by mouth 2 (two) times daily.    Dispense:  180 tablet    Refill:  3    Patient Instructions  Medication Instructions:  START Coreg (Carvedilol) 6.25 mg by mouth twice daily  *If you need a  refill on your cardiac medications before your next appointment, please call your pharmacy*   Lab Work: None  If you have labs (blood work) drawn today and your tests are completely normal, you will receive your results only by: MyChart Message (if you have MyChart) OR A paper copy in the mail If you have any lab test that  is abnormal or we need to change your treatment, we will call you to review the results.   Testing/Procedures: None   Follow-Up: At [facility], you and your health needs are our priority.  As part of our continuing mission to provide you with exceptional heart care, we have created designated Provider Care Teams.  These Care Teams include your primary Cardiologist (physician) and Advanced Practice Providers (APPs -  Physician Assistants and Nurse Practitioners) who all work together to provide you with the care you need, when you need it.  We recommend signing up for the patient portal called "MyChart".  Sign up information is provided on this After Visit Summary.  MyChart is used to connect with patients for Virtual Visits (Telemedicine).  Patients are able to view lab/test results, encounter notes, upcoming appointments, etc.  Non-urgent messages can be sent to your provider as well.   To learn more about what you can do with MyChart, go to [email or web address].    Your next appointment:   4 month(s)  Provider:   Kristeen Miss, MD   Signed, Luke Miss, MD  01/19/2023 9:40 AM    [facility]

## 2023-04-19 ENCOUNTER — Other Ambulatory Visit: Payer: Self-pay | Admitting: Student

## 2023-04-19 DIAGNOSIS — M5030 Other cervical disc degeneration, unspecified cervical region: Secondary | ICD-10-CM

## 2023-04-19 DIAGNOSIS — R200 Anesthesia of skin: Secondary | ICD-10-CM

## 2023-05-09 ENCOUNTER — Ambulatory Visit
Admission: RE | Admit: 2023-05-09 | Discharge: 2023-05-09 | Disposition: A | Discharge status: Home / Self Care | Payer: BC Managed Care – PPO | Source: Ambulatory Visit | Attending: Student | Admitting: Student

## 2023-05-09 DIAGNOSIS — M5030 Other cervical disc degeneration, unspecified cervical region: Secondary | ICD-10-CM

## 2023-05-09 DIAGNOSIS — R200 Anesthesia of skin: Secondary | ICD-10-CM

## 2023-05-09 MED ORDER — GADOPICLENOL 0.5 MMOL/ML IV SOLN
10.0000 mL | Freq: Once | INTRAVENOUS | Status: AC | PRN
  Administered 2023-05-09: 8 mL via INTRAVENOUS

## 2023-06-12 ENCOUNTER — Encounter: Payer: Self-pay | Admitting: Cardiovascular Disease

## 2023-06-12 NOTE — Progress Notes (Signed)
Cardiology Office Note:    Date:  06/12/2023   ID:  Luke Russo, DOB April 18, 1966, MRN [id number]  PCP:  Dorothey Baseman, MD   [facility] Providers Cardiologist:  Kristeen Miss, MD     Referring MD: Dorothey Baseman, MD   Chief Complaint  Patient presents with   Palpitations     History of Present Illness:    Luke Russo is a 57 y.o. male with a hx of HLD, DM Has been having some heart flutters  Has been occurring for 6-7 month Palps are described as a sudden heart rate irregularity which makes him gasp for air.  The episode then resolves immediately after that The episodes are not associated with exercise.  They occur typically at night when he is lying down.  There is no associated shortness of breath syncope or presyncope.  Tries to exercise - walks the dogs frequently   Does not work  Is now retired   Has DM  - is poorly controlled  HBA1C is 11.7 ( [facility] )   No CP   Has some DOE if he overdoes it  Has a history of hyperlipidemia. Labs from [facility] the [facility] reveal Triglyceride level of 365 HDL is 41 LDL is 72 Total cholesterol is 186.  We discussed reducing his intake of foods that are white, wheat, sweat   I suspect most of his shortness of breath with exertion is due to generalized deconditioning.  He really does not exercise at all.  I encouraged him to start walking 3 to 4 miles a day which I think will help.  If he has any episodes of chest pain he is to report back to [place].  His palpitations clinically sound like premature ventricular contractions.  He states that they are not all that severe.  We discussed having him wear a monitor but at this point I am not sure that it would show anything.  Will start him on low-dose carvedilol 6.25 mg twice a day and see if this helps suppress the PVCs.  The carvedilol also will be not as detrimental to his diabetic control.  I encouraged him to work on reducing his  carbohydrates and to start exercising.  He definitely needs to get his diabetes under better control.    Nov. 4, 2024 Luke Russo is seen for his DOE , palpitations     Past Medical History:  Diagnosis Date   Diabetes mellitus without complication (HCC)    History of hiatal hernia    Hyperlipidemia     Past Surgical History:  Procedure Laterality Date   APPENDECTOMY      Current Medications: No outpatient medications have been marked as taking for the 06/13/23 encounter (Office Visit) with Indira Sorenson, Deloris Ping, MD.     Allergies:   Patient has no known allergies.   Social History   Socioeconomic History   Marital status: Married    Spouse name: susan    Number of children: 3   Years of education: Not on file   Highest education level: Associate degree: occupational, Scientist, product/process development, or vocational program  Occupational History   Not on file  Tobacco Use   Smoking status: Never   Smokeless tobacco: Never  Vaping Use   Vaping status: Never Used  Substance and Sexual Activity   Alcohol use: No   Drug use: No   Sexual activity: Not Currently  Other Topics Concern   Not on file  Social History Narrative  Not on file   Social Determinants of Health   Financial Resource Strain: Patient Declined (10/11/2022)   Received from [facility], [facility]   Overall Financial Resource Strain (CARDIA)    Difficulty of Paying Living Expenses: Patient declined  Food Insecurity: Patient Declined (10/11/2022)   Received from [facility], [facility]   Hunger Vital Sign    Worried About Running Out of Food in the Last Year: Patient declined    Ran Out of Food in the Last Year: Patient declined  Transportation Needs: Patient Declined (10/11/2022)   Received from [facility], [facility]   [facility] - Transportation    In the past 12 months, has lack of transportation kept you from medical  appointments or from getting medications?: Patient declined    Lack of Transportation (Non-Medical): Patient declined  Physical Activity: Insufficiently Active (03/22/2019)   Exercise Vital Sign    Days of Exercise per Week: 3 days    Minutes of Exercise per Session: 30 min  Stress: No Stress Concern Present (12/02/2017)   Received from [facility], [facility]   [employer] [facility] - Occupational Stress Questionnaire    Feeling of Stress : Not at all  Social Connections: Unknown (03/22/2019)   Social Connection and Isolation Panel [NHANES]    Frequency of Communication with Friends and Family: Not on file    Frequency of Social Gatherings with Friends and Family: Not on file    Attends Religious Services: Never    Database administrator or Organizations: Yes    Attends Banker Meetings: Never    Marital Status: Married     Family History: The patient's family history includes Breast cancer in his mother; Diabetes in his maternal grandfather, maternal grandmother, and mother; Heart disease in his father, maternal grandfather, maternal grandmother, and mother; Hypertension in his father; Lupus in his sister; Thyroid disease in his mother.  ROS:   Please see the history of present illness.     All other systems reviewed and are negative.  EKGs/Labs/Other Studies Reviewed:    The following studies were reviewed today:   EKG:        Recent Labs: No results found for requested labs within last 365 days.  Recent Lipid Panel    Component Value Date/Time   CHOL 204 (H) 11/08/2013 0359   TRIG 182 11/08/2013 0359   HDL 36 (L) 11/08/2013 0359   VLDL 36 11/08/2013 0359   LDLCALC 132 (H) 11/08/2013 0359     Risk Assessment/Calculations:              Physical Exam:    Physical Exam: There were no vitals taken for this visit.  No BP recorded.  {Refresh Note OR Click here to enter BP  :1}***    GEN:   Well nourished, well developed in no acute distress HEENT: Normal NECK: No JVD; No carotid bruits LYMPHATICS: No lymphadenopathy CARDIAC: RRR ***, no murmurs, rubs, gallops RESPIRATORY:  Clear to auscultation without rales, wheezing or rhonchi  ABDOMEN: Soft, non-tender, non-distended MUSCULOSKELETAL:  No edema; No deformity  SKIN: Warm and dry NEUROLOGIC:  Alert and oriented x 3   ASSESSMENT:    No diagnosis found.  PLAN:       Palpitations:     2.  Dyspnea with exertion        3. DM:  Medication Adjustments/Labs and Tests Ordered: Current medicines are reviewed at length with the patient today.  Concerns regarding medicines are outlined above.  No orders of the defined types were placed in this encounter.  No orders of the defined types were placed in this encounter.   There are no Patient Instructions on file for this visit.   Signed, Kristeen Miss, MD  06/12/2023 7:08 AM    [facility]

## 2023-06-13 ENCOUNTER — Ambulatory Visit: Payer: BC Managed Care – PPO | Attending: Cardiovascular Disease | Admitting: Cardiovascular Disease
